# Patient Record
Sex: Female | Born: 1950 | Race: Black or African American | Hispanic: No | Marital: Single | Smoking: Current every day smoker
Health system: Southern US, Community
[De-identification: ages and names within clinical notes are randomized; demographics above are authoritative.]

## PROBLEM LIST (undated history)

## (undated) DIAGNOSIS — I10 Essential (primary) hypertension: Secondary | ICD-10-CM

## (undated) DIAGNOSIS — I499 Cardiac arrhythmia, unspecified: Secondary | ICD-10-CM

## (undated) DIAGNOSIS — E7800 Pure hypercholesterolemia, unspecified: Secondary | ICD-10-CM

## (undated) DIAGNOSIS — J449 Chronic obstructive pulmonary disease, unspecified: Secondary | ICD-10-CM

---

## 2004-07-14 ENCOUNTER — Emergency Department: Payer: Self-pay | Admitting: Emergency Medicine

## 2005-08-15 ENCOUNTER — Emergency Department: Payer: Self-pay | Admitting: Emergency Medicine

## 2006-02-13 IMAGING — CT CT HEAD WITHOUT CONTRAST
1 series · 16 of 29 positions shown, 20 images · non-contrast
Comparison: none

REASON FOR EXAM: headache     rm 14
COMMENTS:

PROCEDURE:     CT  - CT HEAD WITHOUT CONTRAST  - July 15, 2004 [DATE]
RESULT:        No intra-axial or extra-axial pathologic fluid or blood
collections are identified. No mass lesion is noted.  There is no
hydrocephalus.  Initial report was given by [HOSPITAL] at the time of the
study.

[Series 2: without · axial · non-contrast · 0.45mm/px · z∈[-193,-63]mm · 16 of 29 slices shown, 20 images]
[im 2/29  brain]
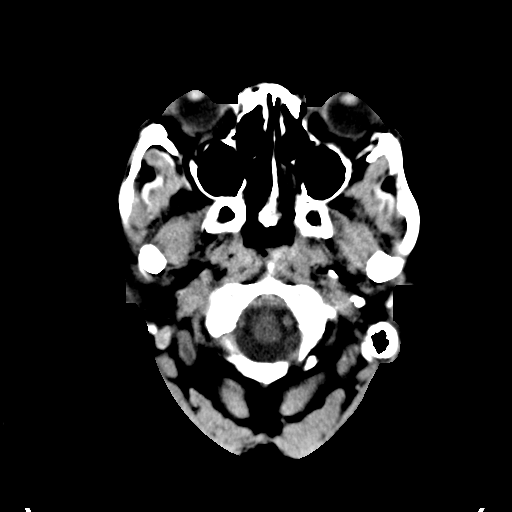
[im 2/29  bone]
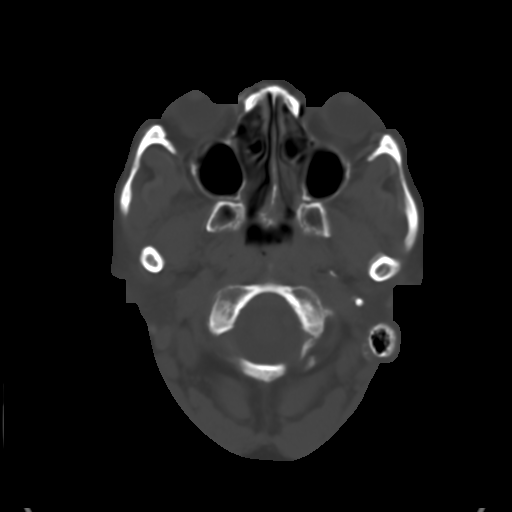
[im 4/29  brain]
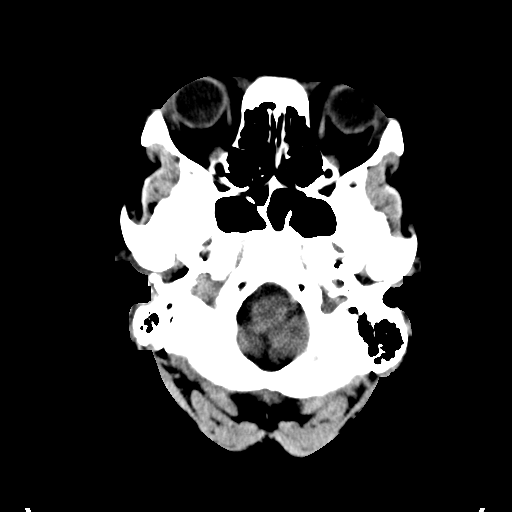
[im 6/29  brain]
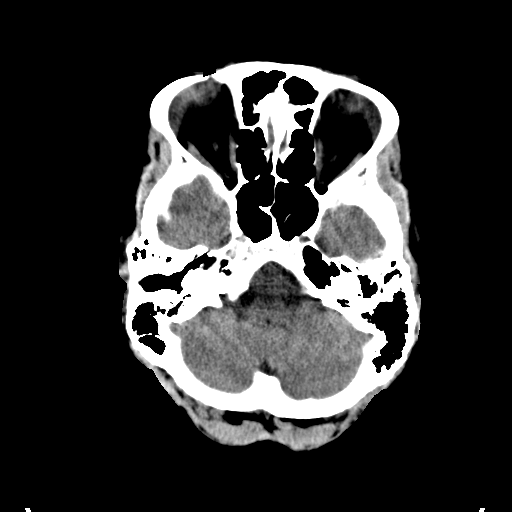
[im 7/29  brain]
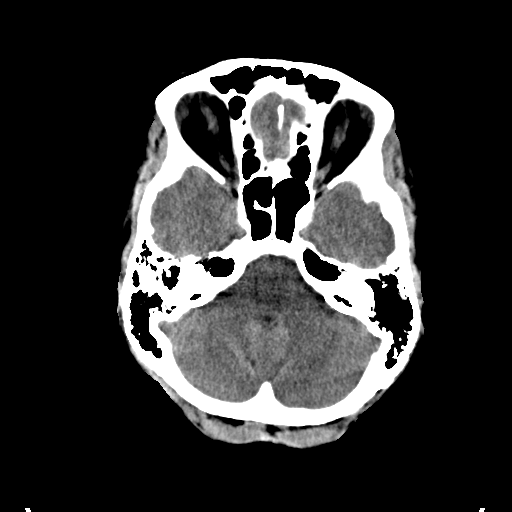
[im 9/29  brain]
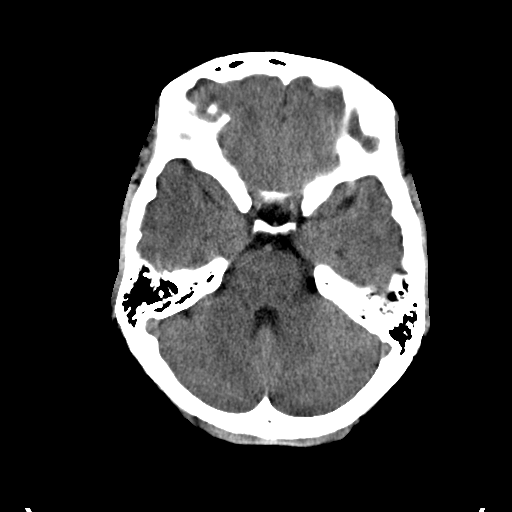
[im 9/29  bone]
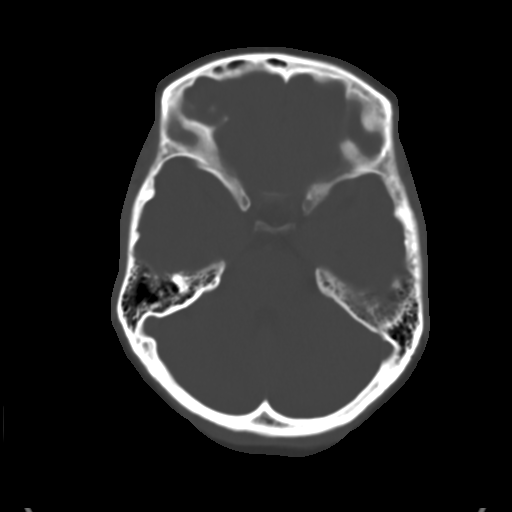
[im 11/29  brain]
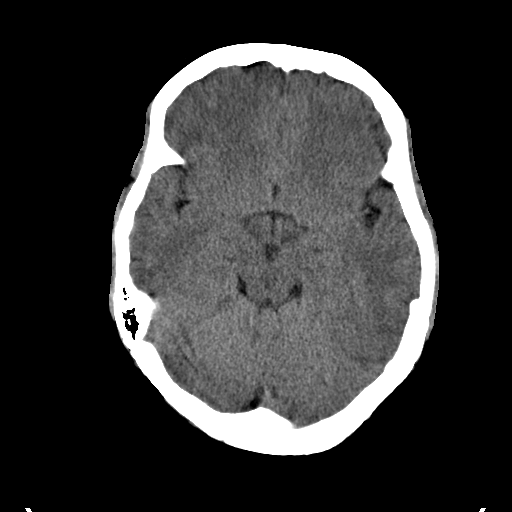
[im 12/29  brain]
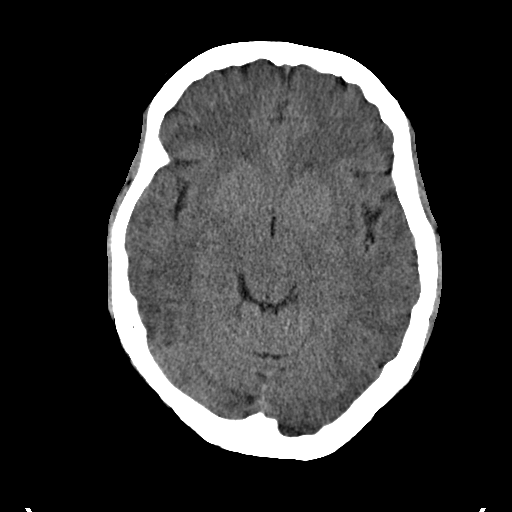
[im 14/29  brain]
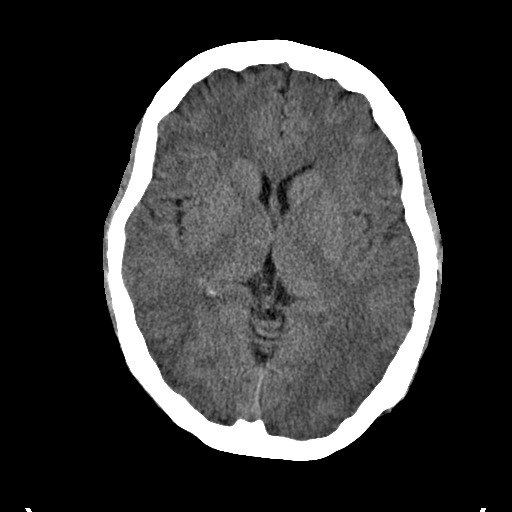
[im 16/29  brain]
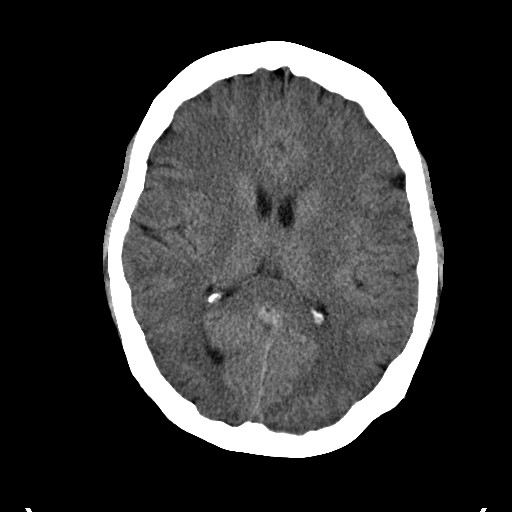
[im 16/29  bone]
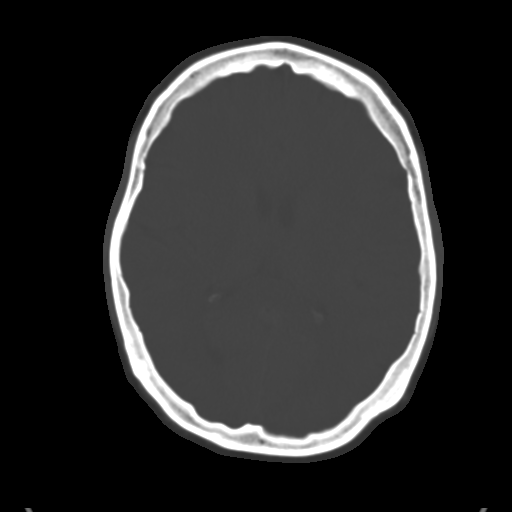
[im 18/29  brain]
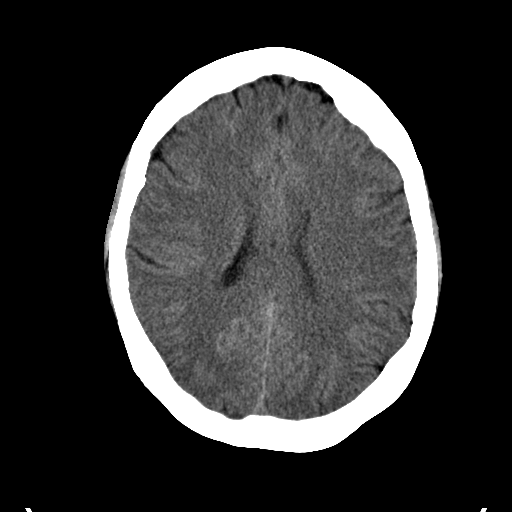
[im 19/29  brain]
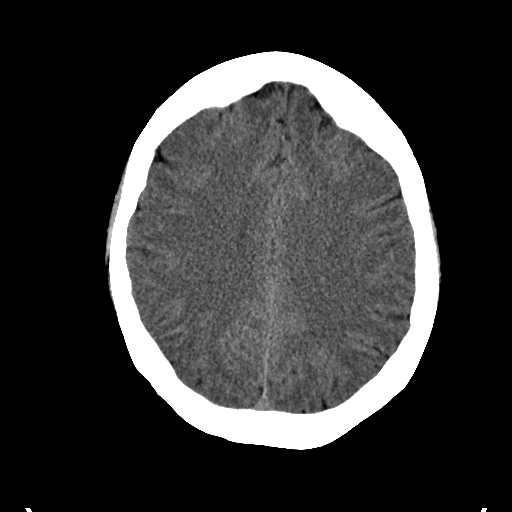
[im 21/29  brain]
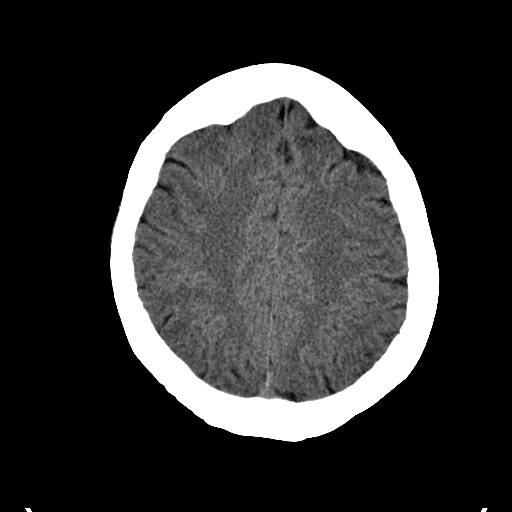
[im 23/29  brain]
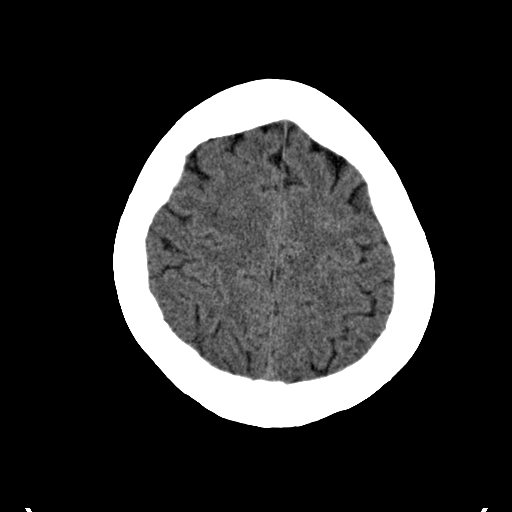
[im 23/29  bone]
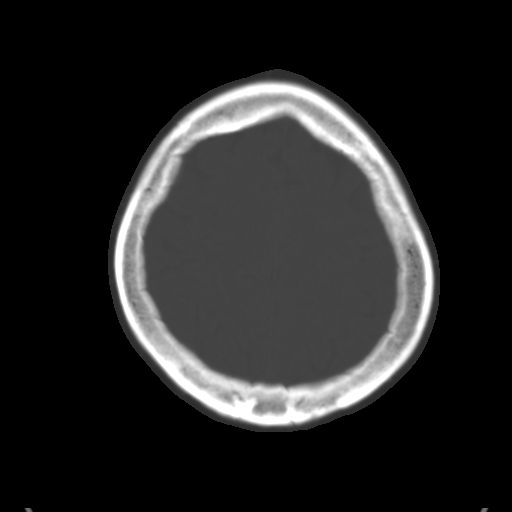
[im 24/29  brain]
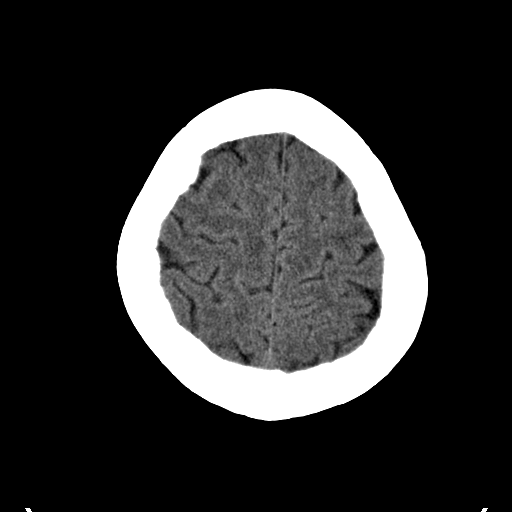
[im 26/29  brain]
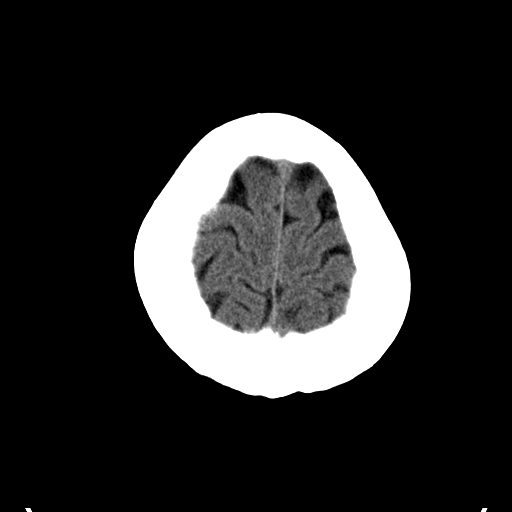
[im 28/29  brain]
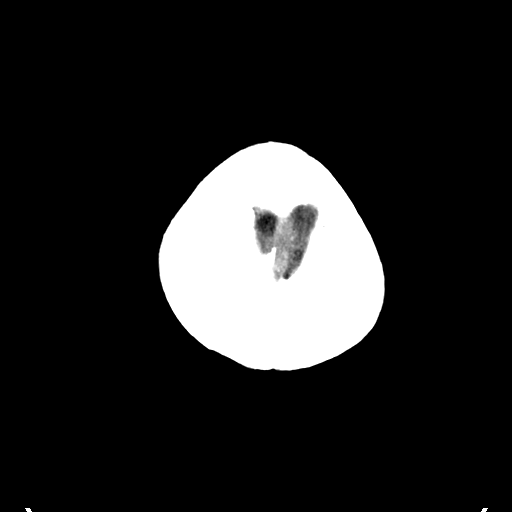

[16 of 29 positions shown; findings below may reference images not displayed]

IMPRESSION: No acute abnormalities are identified.

## 2008-03-01 ENCOUNTER — Other Ambulatory Visit: Payer: Self-pay

## 2008-03-02 ENCOUNTER — Observation Stay: Payer: Self-pay | Admitting: Internal Medicine

## 2009-01-01 ENCOUNTER — Emergency Department: Payer: Self-pay | Admitting: Emergency Medicine

## 2009-10-01 IMAGING — CR DG CHEST 2V
1 series · 2 of 2 positions shown · non-contrast
Comparison: none

REASON FOR EXAM: Chest Pain
COMMENTS:

PROCEDURE:     DXR - DXR CHEST PA (OR AP) AND LATERAL  - March 01, 2008  [DATE]
RESULT:     The lung fields are clear. The heart, mediastinal and osseous
structures show no acute changes. The heart is upper limits to normal in
size or mildly enlarged.

[Series 1: view not recorded · 0.17mm/px · 2 of 2 slices shown]
[im 1/2]
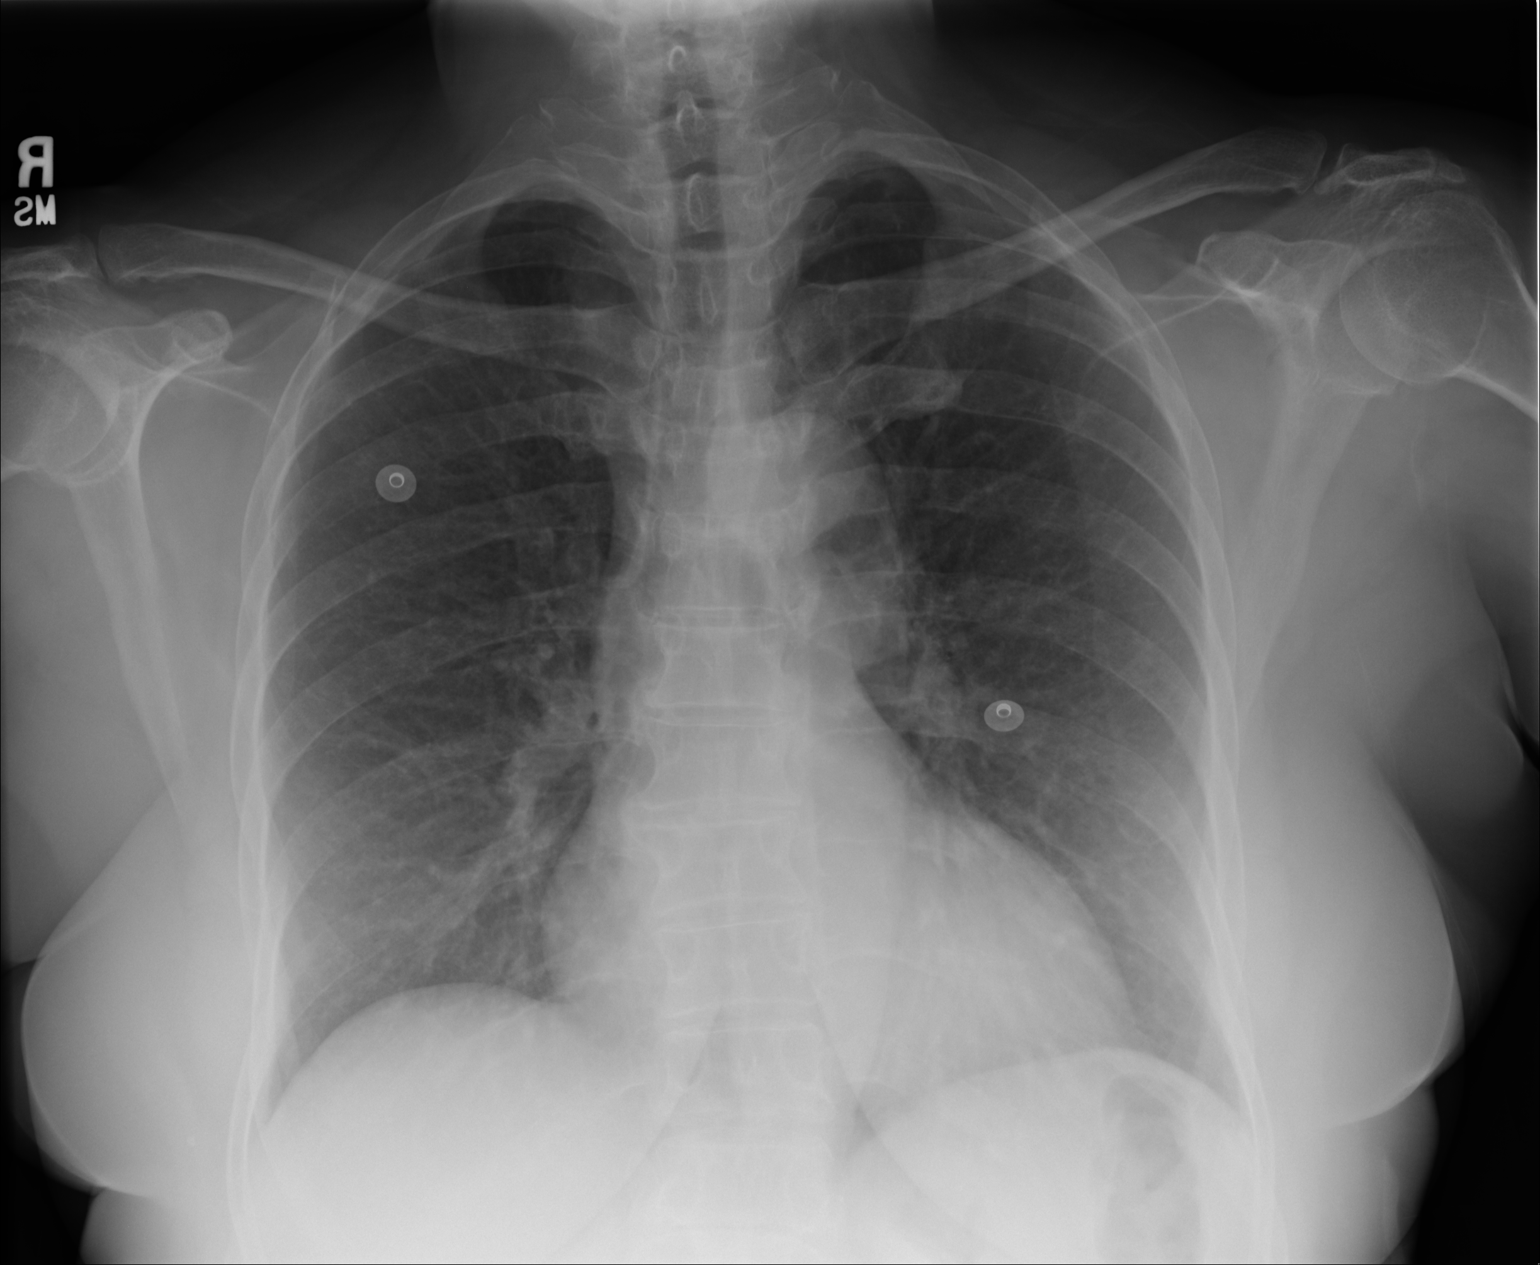
[im 2/2]
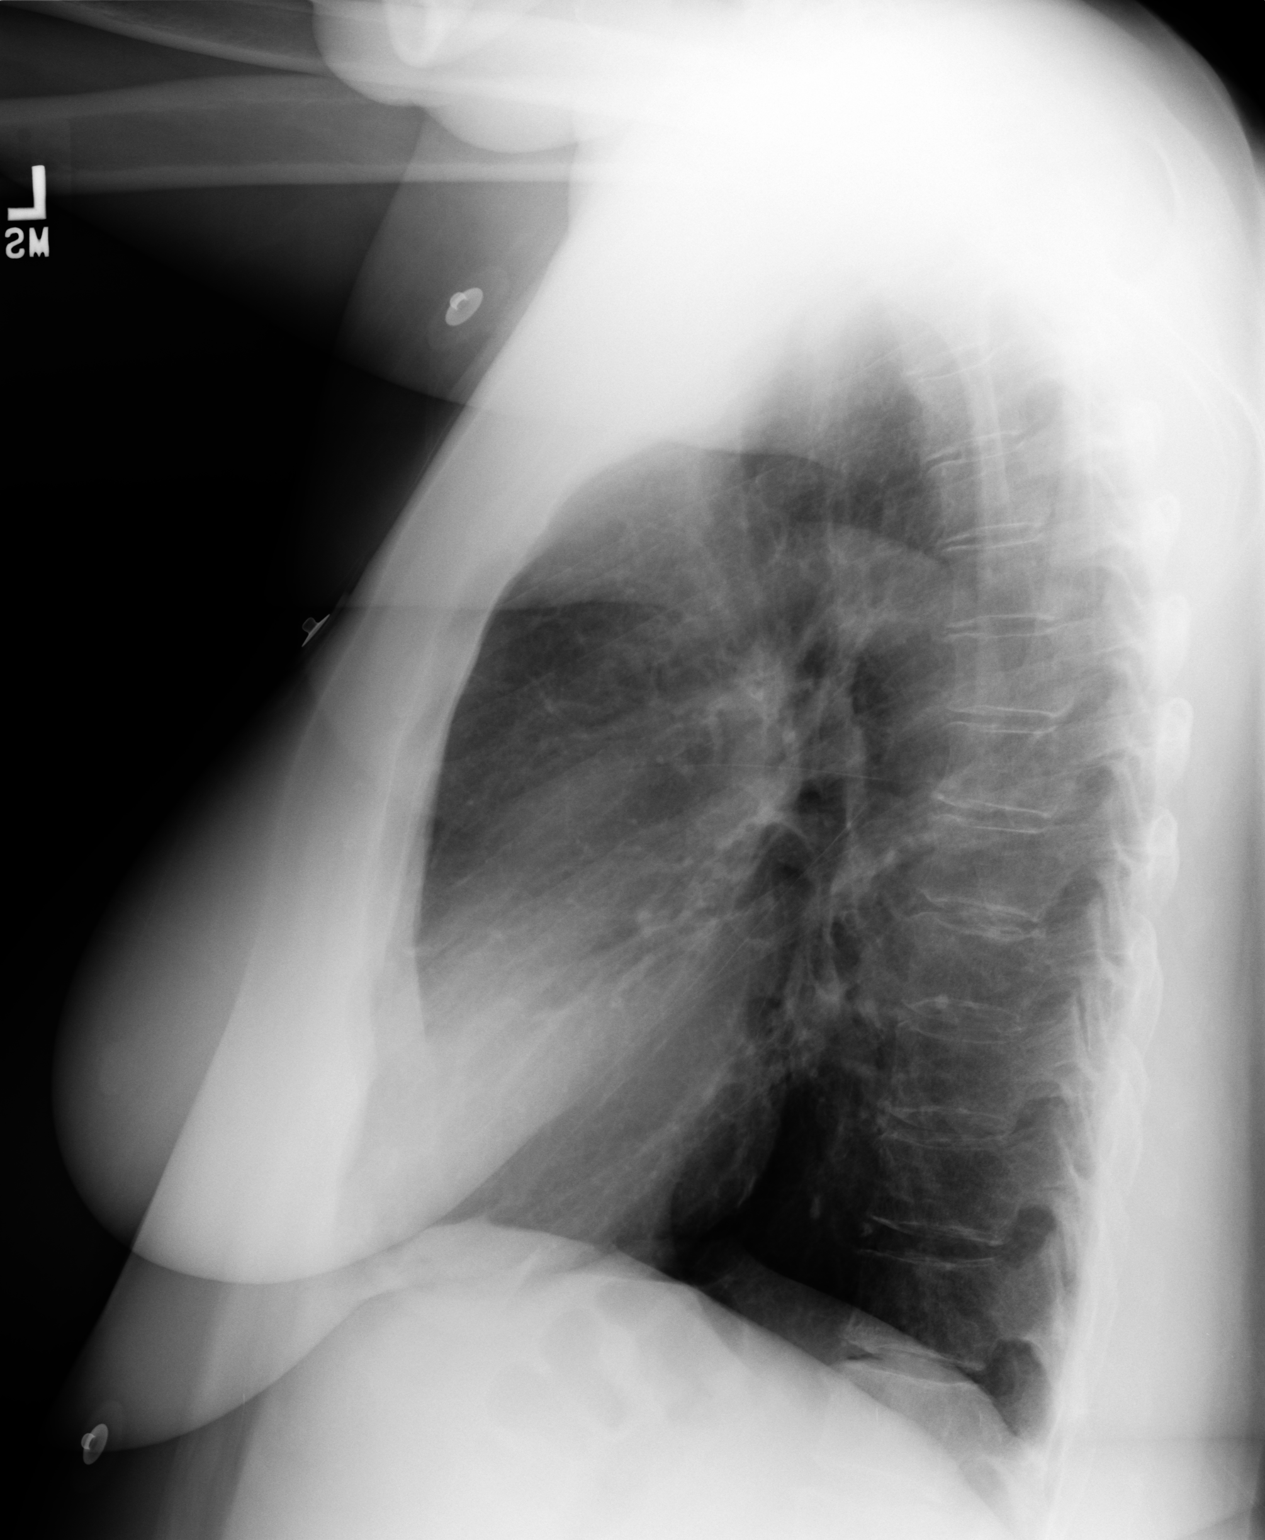

[2 of 2 positions shown; findings below may reference images not displayed]

IMPRESSION: 1.No acute changes are identified.

## 2010-05-21 ENCOUNTER — Ambulatory Visit: Payer: Self-pay | Admitting: Family Medicine

## 2015-07-20 ENCOUNTER — Other Ambulatory Visit: Payer: Self-pay | Admitting: Family Medicine

## 2015-07-20 DIAGNOSIS — Z1231 Encounter for screening mammogram for malignant neoplasm of breast: Secondary | ICD-10-CM

## 2016-01-07 ENCOUNTER — Other Ambulatory Visit: Payer: Self-pay | Admitting: Family Medicine

## 2016-01-07 DIAGNOSIS — Z1239 Encounter for other screening for malignant neoplasm of breast: Secondary | ICD-10-CM

## 2016-01-24 ENCOUNTER — Ambulatory Visit: Payer: Self-pay | Attending: Family Medicine

## 2016-07-27 ENCOUNTER — Inpatient Hospital Stay
Admission: EM | Admit: 2016-07-27 | Discharge: 2016-07-29 | DRG: 287 | Disposition: A | Discharge status: Home / Self Care | Payer: BLUE CROSS/BLUE SHIELD | Attending: Internal Medicine | Admitting: Internal Medicine

## 2016-07-27 ENCOUNTER — Encounter: Payer: Self-pay | Admitting: *Deleted

## 2016-07-27 ENCOUNTER — Emergency Department: Payer: BLUE CROSS/BLUE SHIELD

## 2016-07-27 DIAGNOSIS — R2232 Localized swelling, mass and lump, left upper limb: Secondary | ICD-10-CM | POA: Diagnosis not present

## 2016-07-27 DIAGNOSIS — Z8249 Family history of ischemic heart disease and other diseases of the circulatory system: Secondary | ICD-10-CM | POA: Diagnosis not present

## 2016-07-27 DIAGNOSIS — F329 Major depressive disorder, single episode, unspecified: Secondary | ICD-10-CM | POA: Diagnosis present

## 2016-07-27 DIAGNOSIS — R7303 Prediabetes: Secondary | ICD-10-CM | POA: Diagnosis present

## 2016-07-27 DIAGNOSIS — E876 Hypokalemia: Secondary | ICD-10-CM | POA: Diagnosis present

## 2016-07-27 DIAGNOSIS — E785 Hyperlipidemia, unspecified: Secondary | ICD-10-CM | POA: Diagnosis present

## 2016-07-27 DIAGNOSIS — R509 Fever, unspecified: Secondary | ICD-10-CM | POA: Diagnosis not present

## 2016-07-27 DIAGNOSIS — E669 Obesity, unspecified: Secondary | ICD-10-CM | POA: Diagnosis present

## 2016-07-27 DIAGNOSIS — Z7982 Long term (current) use of aspirin: Secondary | ICD-10-CM | POA: Diagnosis not present

## 2016-07-27 DIAGNOSIS — R739 Hyperglycemia, unspecified: Secondary | ICD-10-CM | POA: Diagnosis present

## 2016-07-27 DIAGNOSIS — Z6832 Body mass index (BMI) 32.0-32.9, adult: Secondary | ICD-10-CM | POA: Diagnosis not present

## 2016-07-27 DIAGNOSIS — R079 Chest pain, unspecified: Secondary | ICD-10-CM

## 2016-07-27 DIAGNOSIS — I10 Essential (primary) hypertension: Secondary | ICD-10-CM | POA: Diagnosis present

## 2016-07-27 DIAGNOSIS — Z880 Allergy status to penicillin: Secondary | ICD-10-CM

## 2016-07-27 DIAGNOSIS — F17200 Nicotine dependence, unspecified, uncomplicated: Secondary | ICD-10-CM | POA: Diagnosis present

## 2016-07-27 DIAGNOSIS — I472 Ventricular tachycardia, unspecified: Secondary | ICD-10-CM

## 2016-07-27 DIAGNOSIS — I4729 Other ventricular tachycardia: Secondary | ICD-10-CM

## 2016-07-27 DIAGNOSIS — Z23 Encounter for immunization: Secondary | ICD-10-CM

## 2016-07-27 DIAGNOSIS — I25110 Atherosclerotic heart disease of native coronary artery with unstable angina pectoris: Secondary | ICD-10-CM | POA: Diagnosis present

## 2016-07-27 DIAGNOSIS — R55 Syncope and collapse: Secondary | ICD-10-CM | POA: Diagnosis present

## 2016-07-27 DIAGNOSIS — M7989 Other specified soft tissue disorders: Secondary | ICD-10-CM

## 2016-07-27 HISTORY — DX: Pure hypercholesterolemia, unspecified: E78.00

## 2016-07-27 HISTORY — DX: Essential (primary) hypertension: I10

## 2016-07-27 HISTORY — DX: Cardiac arrhythmia, unspecified: I49.9

## 2016-07-27 LAB — COMPREHENSIVE METABOLIC PANEL
ALT: 34 U/L (ref 14–54)
ANION GAP: 8 (ref 5–15)
AST: 30 U/L (ref 15–41)
Albumin: 3.8 g/dL (ref 3.5–5.0)
Alkaline Phosphatase: 54 U/L (ref 38–126)
BILIRUBIN TOTAL: 0.2 mg/dL — AB (ref 0.3–1.2)
BUN: 19 mg/dL (ref 6–20)
CO2: 28 mmol/L (ref 22–32)
Calcium: 8.9 mg/dL (ref 8.9–10.3)
Chloride: 106 mmol/L (ref 101–111)
Creatinine, Ser: 0.86 mg/dL (ref 0.44–1.00)
GFR calc Af Amer: >60 mL/min (ref >60)
Glucose, Bld: 153 mg/dL — ABNORMAL HIGH (ref 65–99)
POTASSIUM: 3.1 mmol/L — AB (ref 3.5–5.1)
Sodium: 142 mmol/L (ref 135–145)
TOTAL PROTEIN: 7 g/dL (ref 6.5–8.1)

## 2016-07-27 LAB — BRAIN NATRIURETIC PEPTIDE: B Natriuretic Peptide: 188 pg/mL — ABNORMAL HIGH (ref 0.0–100.0)

## 2016-07-27 LAB — CBC WITH DIFFERENTIAL/PLATELET
Basophils Absolute: 0.1 10*3/uL (ref 0–0.1)
Basophils Relative: 2 %
Eosinophils Absolute: 0.2 10*3/uL (ref 0–0.7)
Eosinophils Relative: 3 %
HEMATOCRIT: 40.5 % (ref 35.0–47.0)
Hemoglobin: 13.5 g/dL (ref 12.0–16.0)
Lymphocytes Relative: 42 %
Lymphs Abs: 2.6 10*3/uL (ref 1.0–3.6)
MCH: 28.2 pg (ref 26.0–34.0)
MCHC: 33.4 g/dL (ref 32.0–36.0)
MCV: 84.3 fL (ref 80.0–100.0)
MONO ABS: 0.4 10*3/uL (ref 0.2–0.9)
MONOS PCT: 7 %
NEUTROS ABS: 3 10*3/uL (ref 1.4–6.5)
Neutrophils Relative %: 46 %
Platelets: 223 10*3/uL (ref 150–440)
RBC: 4.8 MIL/uL (ref 3.80–5.20)
RDW: 13.8 % (ref 11.5–14.5)
WBC: 6.3 10*3/uL (ref 3.6–11.0)

## 2016-07-27 LAB — TROPONIN I
Troponin I: <0.03 ng/mL (ref <0.03)
Troponin I: <0.03 ng/mL (ref <0.03)

## 2016-07-27 LAB — PROTIME-INR
INR: 0.92
Prothrombin Time: 12.4 seconds (ref 11.4–15.2)

## 2016-07-27 LAB — MRSA PCR SCREENING: MRSA BY PCR: NEGATIVE

## 2016-07-27 LAB — TSH: TSH: 0.653 u[IU]/mL (ref 0.350–4.500)

## 2016-07-27 LAB — GLUCOSE, CAPILLARY: Glucose-Capillary: 127 mg/dL — ABNORMAL HIGH (ref 65–99)

## 2016-07-27 LAB — MAGNESIUM: MAGNESIUM: 2.4 mg/dL (ref 1.7–2.4)

## 2016-07-27 MED ORDER — ACETAMINOPHEN 650 MG RE SUPP: 650.0000 mg | Freq: Four times a day (QID) | RECTAL | Status: DC | PRN

## 2016-07-27 MED ORDER — ASPIRIN 81 MG PO CHEW
324.0000 mg | CHEWABLE_TABLET | Freq: Once | ORAL | Status: AC
  Administered 2016-07-27: 324 mg via ORAL

## 2016-07-27 MED ORDER — ENOXAPARIN SODIUM 40 MG/0.4ML ~~LOC~~ SOLN
40.0000 mg | SUBCUTANEOUS | Status: DC
  Administered 2016-07-27 – 2016-07-28 (×2): 40 mg via SUBCUTANEOUS
  Filled 2016-07-27 (×2): qty 0.4

## 2016-07-27 MED ORDER — ONDANSETRON HCL 4 MG PO TABS: 4.0000 mg | ORAL_TABLET | Freq: Four times a day (QID) | ORAL | Status: DC | PRN

## 2016-07-27 MED ORDER — METOPROLOL TARTRATE 5 MG/5ML IV SOLN
2.5000 mg | Freq: Once | INTRAVENOUS | Status: AC
  Administered 2016-07-27: 2.5 mg via INTRAVENOUS
  Filled 2016-07-27: qty 5

## 2016-07-27 MED ORDER — SODIUM CHLORIDE 0.9 % IV SOLN
INTRAVENOUS | Status: DC
  Administered 2016-07-27 (×2): via INTRAVENOUS
  Filled 2016-07-27 (×6): qty 200

## 2016-07-27 MED ORDER — AMIODARONE HCL IN DEXTROSE 360-4.14 MG/200ML-% IV SOLN: 30.0000 mg/h | INTRAVENOUS | Status: DC

## 2016-07-27 MED ORDER — SODIUM CHLORIDE 0.9 % IV SOLN: 250.0000 mL | INTRAVENOUS | Status: DC | PRN

## 2016-07-27 MED ORDER — AMIODARONE LOAD VIA INFUSION
150.0000 mg | Freq: Once | INTRAVENOUS | Status: DC
  Filled 2016-07-27 (×2): qty 83.34

## 2016-07-27 MED ORDER — SODIUM CHLORIDE 0.9% FLUSH
3.0000 mL | Freq: Two times a day (BID) | INTRAVENOUS | Status: DC
  Administered 2016-07-27 – 2016-07-29 (×4): 3 mL via INTRAVENOUS

## 2016-07-27 MED ORDER — PNEUMOCOCCAL VAC POLYVALENT 25 MCG/0.5ML IJ INJ
0.5000 mL | INJECTION | INTRAMUSCULAR | Status: AC
  Administered 2016-07-28: 0.5 mL via INTRAMUSCULAR
  Filled 2016-07-27: qty 0.5

## 2016-07-27 MED ORDER — SODIUM CHLORIDE 0.9 % WEIGHT BASED INFUSION
3.0000 mL/kg/h | INTRAVENOUS | Status: AC
  Administered 2016-07-28: 3 mL/kg/h via INTRAVENOUS

## 2016-07-27 MED ORDER — AMIODARONE IV BOLUS ONLY 150 MG/100ML
INTRAVENOUS | Status: AC
  Administered 2016-07-27: 150 mg
  Filled 2016-07-27: qty 100

## 2016-07-27 MED ORDER — MORPHINE SULFATE (PF) 4 MG/ML IV SOLN
2.0000 mg | INTRAVENOUS | Status: DC | PRN
  Administered 2016-07-28: 2 mg via INTRAVENOUS
  Filled 2016-07-27: qty 1

## 2016-07-27 MED ORDER — ONDANSETRON HCL 4 MG/2ML IJ SOLN: 4.0000 mg | Freq: Four times a day (QID) | INTRAMUSCULAR | Status: DC | PRN

## 2016-07-27 MED ORDER — ASPIRIN 81 MG PO CHEW
CHEWABLE_TABLET | ORAL | Status: AC
  Administered 2016-07-27: 324 mg via ORAL
  Filled 2016-07-27: qty 4

## 2016-07-27 MED ORDER — METOPROLOL TARTRATE 25 MG PO TABS
25.0000 mg | ORAL_TABLET | Freq: Two times a day (BID) | ORAL | Status: DC
  Administered 2016-07-28 – 2016-07-29 (×3): 25 mg via ORAL
  Filled 2016-07-27 (×3): qty 1

## 2016-07-27 MED ORDER — SODIUM CHLORIDE 0.9 % IV SOLN
Freq: Once | INTRAVENOUS | Status: AC
  Administered 2016-07-27: 11:00:00 via INTRAVENOUS
  Filled 2016-07-27: qty 500

## 2016-07-27 MED ORDER — ASPIRIN 81 MG PO CHEW: 81.0000 mg | CHEWABLE_TABLET | ORAL | Status: DC

## 2016-07-27 MED ORDER — NITROGLYCERIN 2 % TD OINT
0.5000 [in_us] | TOPICAL_OINTMENT | Freq: Four times a day (QID) | TRANSDERMAL | Status: DC
  Administered 2016-07-27 – 2016-07-28 (×4): 0.5 [in_us] via TOPICAL
  Filled 2016-07-27 (×4): qty 1

## 2016-07-27 MED ORDER — BISACODYL 10 MG RE SUPP: 10.0000 mg | Freq: Every day | RECTAL | Status: DC | PRN

## 2016-07-27 MED ORDER — SODIUM CHLORIDE 0.9% FLUSH
3.0000 mL | Freq: Two times a day (BID) | INTRAVENOUS | Status: DC
  Administered 2016-07-27 – 2016-07-28 (×2): 3 mL via INTRAVENOUS

## 2016-07-27 MED ORDER — MAGNESIUM SULFATE 2 GM/50ML IV SOLN
INTRAVENOUS | Status: AC
  Administered 2016-07-27: 2 g via INTRAVENOUS
  Filled 2016-07-27: qty 50

## 2016-07-27 MED ORDER — SODIUM CHLORIDE 0.9 % IV SOLN
INTRAVENOUS | Status: AC
  Filled 2016-07-27: qty 200

## 2016-07-27 MED ORDER — FAMOTIDINE IN NACL 20-0.9 MG/50ML-% IV SOLN: 20.0000 mg | Freq: Two times a day (BID) | INTRAVENOUS | Status: DC

## 2016-07-27 MED ORDER — INFLUENZA VAC SPLIT QUAD 0.5 ML IM SUSY
0.5000 mL | PREFILLED_SYRINGE | INTRAMUSCULAR | Status: AC
  Administered 2016-07-28: 0.5 mL via INTRAMUSCULAR
  Filled 2016-07-27: qty 0.5

## 2016-07-27 MED ORDER — PRAVASTATIN SODIUM 40 MG PO TABS
40.0000 mg | ORAL_TABLET | Freq: Every day | ORAL | Status: DC
  Administered 2016-07-27 – 2016-07-29 (×2): 40 mg via ORAL
  Filled 2016-07-27 (×2): qty 1

## 2016-07-27 MED ORDER — AMIODARONE HCL IN DEXTROSE 360-4.14 MG/200ML-% IV SOLN: 60.0000 mg/h | INTRAVENOUS | Status: DC

## 2016-07-27 MED ORDER — ASPIRIN EC 81 MG PO TBEC
81.0000 mg | DELAYED_RELEASE_TABLET | Freq: Every day | ORAL | Status: DC
  Administered 2016-07-27 – 2016-07-29 (×3): 81 mg via ORAL
  Filled 2016-07-27 (×3): qty 1

## 2016-07-27 MED ORDER — ACETAMINOPHEN 325 MG PO TABS
650.0000 mg | ORAL_TABLET | Freq: Four times a day (QID) | ORAL | Status: DC | PRN
  Administered 2016-07-27 – 2016-07-29 (×5): 650 mg via ORAL
  Filled 2016-07-27 (×5): qty 2

## 2016-07-27 MED ORDER — SODIUM CHLORIDE 0.9% FLUSH
3.0000 mL | INTRAVENOUS | Status: DC | PRN
Start: 2016-07-27 — End: 2016-07-28

## 2016-07-27 MED ORDER — FAMOTIDINE 20 MG PO TABS
20.0000 mg | ORAL_TABLET | Freq: Two times a day (BID) | ORAL | Status: DC
  Administered 2016-07-27 – 2016-07-29 (×3): 20 mg via ORAL
  Filled 2016-07-27 (×3): qty 1

## 2016-07-27 MED ORDER — NITROGLYCERIN 0.4 MG SL SUBL: 0.4000 mg | SUBLINGUAL_TABLET | SUBLINGUAL | Status: DC | PRN

## 2016-07-27 MED ORDER — POTASSIUM CHLORIDE IN NACL 20-0.9 MEQ/L-% IV SOLN
INTRAVENOUS | Status: DC
  Administered 2016-07-27 – 2016-07-28 (×2): via INTRAVENOUS
  Filled 2016-07-27 (×6): qty 1000

## 2016-07-27 MED ORDER — DOCUSATE SODIUM 100 MG PO CAPS
100.0000 mg | ORAL_CAPSULE | Freq: Two times a day (BID) | ORAL | Status: DC
  Administered 2016-07-27 – 2016-07-29 (×3): 100 mg via ORAL
  Filled 2016-07-27 (×3): qty 1

## 2016-07-27 MED ORDER — FLUOXETINE HCL 20 MG PO CAPS
20.0000 mg | ORAL_CAPSULE | Freq: Every day | ORAL | Status: DC
  Administered 2016-07-27 – 2016-07-29 (×2): 20 mg via ORAL
  Filled 2016-07-27 (×2): qty 1

## 2016-07-27 MED ORDER — METOPROLOL TARTRATE 25 MG PO TABS: 25.0000 mg | ORAL_TABLET | Freq: Two times a day (BID) | ORAL | Status: DC

## 2016-07-27 MED ORDER — MAGNESIUM SULFATE 2 GM/50ML IV SOLN
2.0000 g | Freq: Once | INTRAVENOUS | Status: AC
  Administered 2016-07-27: 2 g via INTRAVENOUS

## 2016-07-27 MED ORDER — SODIUM CHLORIDE 0.9 % WEIGHT BASED INFUSION: 1.0000 mL/kg/h | INTRAVENOUS | Status: DC

## 2016-07-27 MED ORDER — POTASSIUM CHLORIDE CRYS ER 20 MEQ PO TBCR
40.0000 meq | EXTENDED_RELEASE_TABLET | Freq: Once | ORAL | Status: AC
  Administered 2016-07-27: 40 meq via ORAL
  Filled 2016-07-27: qty 2

## 2016-07-27 NOTE — Progress Notes (Signed)
Chaplain received an order to visit with pt in room Ic3. Provided information for an Advanced Directive.    07/27/16 1221  Clinical Encounter Type  Visited With Patient;Patient and family together  Visit Type Initial  Referral From Nurse  Consult/Referral To Chaplain  Spiritual Encounters  Spiritual Needs Other (Comment)

## 2016-07-27 NOTE — Consult Note (Signed)
Reason for Consult: Unstable angina nonsustained ventricular tachycardia Referring Physician: Dr. Georgie Chard hospitalist, [facility]  Beth Odonnell is an 66 y.o. female.  HPI: Patient is a female history of multiple medical problems smoking borderline diabetes hypertension hyperlipidemia presented with anginal symptoms midsternal chest tightness over the last few weeks with radiation to the left jaw and neck. Patient also complains of dyspnea shortness of breath at rest patient also had palpitations and tachycardia. Patient complains of near syncope no nausea vomiting or diarrhea she's been compliant with her medications except she ran out of her clonidine which made her blood pressure higher than normal. Patient was seen in emergency room found to have nonsustained ventricular tachycardia place and amiodarone which improved her symptoms but she still complains of intermittent chest discomfort or dyspnea no previous myocardial infarction of recent functional study.  Past Medical History:  Diagnosis Date  . Hypercholesteremia   . Hypertension   . Irregular heart beat     History reviewed. No pertinent surgical history.  History reviewed. No pertinent family history.  Social History:  reports that she has been smoking.  She has never used smokeless tobacco. She reports that she does not drink alcohol. Her drug history is not on file.  Allergies:  Allergies  Allergen Reactions  . Penicillins Other (See Comments)    Patient passed out.  Has patient had a PCN reaction causing immediate rash, facial/tongue/throat swelling, SOB or lightheadedness with hypotension: No Has patient had a PCN reaction causing severe rash involving mucus membranes or skin necrosis: No Has patient had a PCN reaction that required hospitalization No Has patient had a PCN reaction occurring within the last 10 years: No If all of the above answers are "NO", then may proceed with  Cephalosporin use.    Medications: I have reviewed the patient's current medications.  Results for orders placed or performed during the hospital encounter of 07/27/16 (from the past 48 hour(s))  Brain natriuretic peptide     Status: Abnormal   Collection Time: 07/27/16  8:41 AM  Result Value Ref Range   B Natriuretic Peptide 188.0 (H) 0.0 - 100.0 pg/mL  CBC with Differential/Platelet     Status: None   Collection Time: 07/27/16  8:41 AM  Result Value Ref Range   WBC 6.3 3.6 - 11.0 K/uL   RBC 4.80 3.80 - 5.20 MIL/uL   Hemoglobin 13.5 12.0 - 16.0 g/dL   HCT 40.5 35.0 - 47.0 %   MCV 84.3 80.0 - 100.0 fL   MCH 28.2 26.0 - 34.0 pg   MCHC 33.4 32.0 - 36.0 g/dL   RDW 13.8 11.5 - 14.5 %   Platelets 223 150 - 440 K/uL   Neutrophils Relative % 46 %   Neutro Abs 3.0 1.4 - 6.5 K/uL   Lymphocytes Relative 42 %   Lymphs Abs 2.6 1.0 - 3.6 K/uL   Monocytes Relative 7 %   Monocytes Absolute 0.4 0.2 - 0.9 K/uL   Eosinophils Relative 3 %   Eosinophils Absolute 0.2 0 - 0.7 K/uL   Basophils Relative 2 %   Basophils Absolute 0.1 0 - 0.1 K/uL  Comprehensive metabolic panel     Status: Abnormal   Collection Time: 07/27/16  8:41 AM  Result Value Ref Range   Sodium 142 135 - 145 mmol/L   Potassium 3.1 (L) 3.5 - 5.1 mmol/L   Chloride 106 101 - 111 mmol/L   CO2 28 22 - 32 mmol/L   Glucose, Bld  153 (H) 65 - 99 mg/dL   BUN 19 6 - 20 mg/dL   Creatinine, Ser 0.86 0.44 - 1.00 mg/dL   Calcium 8.9 8.9 - 10.3 mg/dL   Total Protein 7.0 6.5 - 8.1 g/dL   Albumin 3.8 3.5 - 5.0 g/dL   AST 30 15 - 41 U/L   ALT 34 14 - 54 U/L   Alkaline Phosphatase 54 38 - 126 U/L   Total Bilirubin 0.2 (L) 0.3 - 1.2 mg/dL   GFR calc non Af Amer >60 >60 mL/min   GFR calc Af Amer >60 >60 mL/min    Comment: (NOTE) The eGFR has been calculated using the CKD EPI equation. This calculation has not been validated in all clinical situations. eGFR's persistently <60 mL/min signify possible Chronic Kidney Disease.    Anion gap  8 5 - 15  Magnesium     Status: None   Collection Time: 07/27/16  8:41 AM  Result Value Ref Range   Magnesium 2.4 1.7 - 2.4 mg/dL  Protime-INR     Status: None   Collection Time: 07/27/16  8:41 AM  Result Value Ref Range   Prothrombin Time 12.4 11.4 - 15.2 seconds   INR 0.92   Troponin I     Status: None   Collection Time: 07/27/16  8:41 AM  Result Value Ref Range   Troponin I <0.03 <0.03 ng/mL  TSH     Status: None   Collection Time: 07/27/16  8:41 AM  Result Value Ref Range   TSH 0.653 0.350 - 4.500 uIU/mL    Comment: Performed by a 3rd Generation assay with a functional sensitivity of <=0.01 uIU/mL.  Glucose, capillary     Status: Abnormal   Collection Time: 07/27/16 12:04 PM  Result Value Ref Range   Glucose-Capillary 127 (H) 65 - 99 mg/dL  MRSA PCR Screening     Status: None   Collection Time: 07/27/16 12:13 PM  Result Value Ref Range   MRSA by PCR NEGATIVE NEGATIVE    Comment:        The GeneXpert MRSA Assay (FDA approved for NASAL specimens only), is one component of a comprehensive MRSA colonization surveillance program. It is not intended to diagnose MRSA infection nor to guide or monitor treatment for MRSA infections.     Dg Chest Port 1 View  Result Date: 07/27/2016 CLINICAL DATA:  Worsening chest pain. Palpitations. Dizziness and shortness of breath. EXAM: PORTABLE CHEST 1 VIEW COMPARISON:  03/01/2008 FINDINGS: Pacemaker/ defibrillator is overlie the chest. Heart size is normal. There is aortic atherosclerosis. Lungs are clear. The vascularity is normal. No effusions. IMPRESSION: No active disease.  Overlying devices.  Aortic atherosclerosis. Electronically Signed   By: Nelson Chimes M.D.   On: 07/27/2016 07:51    Review of Systems  Constitutional: Positive for diaphoresis and malaise/fatigue.  HENT: Positive for congestion.   Eyes: Negative.   Respiratory: Positive for shortness of breath.   Cardiovascular: Positive for chest pain, palpitations, orthopnea  and PND.  Gastrointestinal: Negative.   Genitourinary: Negative.   Musculoskeletal: Negative.   Skin: Negative.   Neurological: Positive for dizziness and weakness.  Endo/Heme/Allergies: Negative.   Psychiatric/Behavioral: Positive for depression.   Blood pressure 118/75, pulse (!) 54, temperature 97.6 F (36.4 C), resp. rate 16, height _0  (1.803 m), weight 104.3 kg (230 lb), SpO2 93 %. Physical Exam  Nursing note and vitals reviewed. Constitutional: She is oriented to person, place, and time. She appears well-developed and well-nourished.  HENT:  Head: Normocephalic and atraumatic.  Eyes: Conjunctivae and EOM are normal. Pupils are equal, round, and reactive to light.  Neck: Normal range of motion. Neck supple.  Cardiovascular: Normal rate and regular rhythm.  Exam reveals gallop.   Murmur heard. Respiratory: Effort normal and breath sounds normal.  GI: Soft. Bowel sounds are normal.  Musculoskeletal: Normal range of motion.  Neurological: She is alert and oriented to person, place, and time. She has normal reflexes.  Skin: Skin is warm and dry.  Psychiatric: She has a normal mood and affect.    Assessment/Plan: Nonsustained ventricular tachycardia Unstable angina Hypertension Hypokalemia Near-syncope Chest pain Depression Hyperlipidemia Smoking Borderline diabetes Mild obesity Shortness of breath with dyspnea Palpitations tachycardia . 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

## 2016-07-27 NOTE — ED Provider Notes (Signed)
[facility] Emergency Department Provider Note  ____________________________________________  Time seen: Approximately 7:17 AM  I have reviewed the triage vital signs and the nursing notes.   HISTORY  Chief Complaint Chest Pain; Shortness of Breath; and Dizziness   HPI Beth Odonnell is a 66 y.o. female with a history of hypertension and hyperlipidemia who presents for evaluation of palpitations and dizziness. Patient reports that she ran out of her clonidine the beginning of the week for the last 3-4 days she has had multiple episodes a day of palpitations, dizziness, and mild chest tightness. She also has had some shortness of breath. She reports that her chest tightness centrally, radiating to her neck, jaw, and back. Patient has family history of ischemic heart disease and reports that her mother passed away from a heart attack in her 73s or 20s. Patient is a smoker.Last echocardiogram in [id number] showing diastolic dysfunction. Hasn't had a stress test in over 10 years. Patient currently endorses that her chest pain is moderate. These episodes have been happening every 5 minutes.  Past Medical History:  Diagnosis Date  . Hypercholesteremia   . Hypertension   . Irregular heart beat     Patient Active Problem List   Diagnosis Date Noted  . Nonsustained ventricular tachycardia ([place]) 07/27/2016  . Chest pain 07/27/2016  . Hypokalemia 07/27/2016  . Near syncope 07/27/2016  . NSVT (nonsustained ventricular tachycardia) ([place]) 07/27/2016    History reviewed. No pertinent surgical history.  Prior to Admission medications   Medication Sig Start Date End Date Taking? Authorizing Provider  amLODipine (NORVASC) 10 MG tablet Take 10 mg by mouth daily. 05/06/16  Yes Historical Provider, MD  aspirin EC 81 MG tablet Take 81 mg by mouth daily.   Yes Historical Provider, MD  atenolol (TENORMIN) 50 MG tablet Take 50 mg by mouth daily.   Yes Historical Provider, MD    cetirizine (ZYRTEC) 10 MG tablet Take 10 mg by mouth daily.   Yes Historical Provider, MD  cloNIDine (CATAPRES) 0.3 MG tablet Take 0.3 mg by mouth daily. 07/25/16  Yes Historical Provider, MD  FLUoxetine (PROZAC) 20 MG capsule Take 20 mg by mouth daily.   Yes Historical Provider, MD  lisinopril-hydrochlorothiazide (PRINZIDE,ZESTORETIC) 20-12.5 MG tablet Take 2 tablets by mouth daily.   Yes Historical Provider, MD  pravastatin (PRAVACHOL) 40 MG tablet Take 40 mg by mouth daily.   Yes Historical Provider, MD    Allergies Penicillins  History reviewed. No pertinent family history.  Social History Social History  Substance Use Topics  . Smoking status: Current Every Day Smoker  . Smokeless tobacco: Never Used  . Alcohol use No    Review of Systems  Constitutional: Negative for fever. + Lightheadedness Eyes: Negative for visual changes. ENT: Negative for sore throat. Neck: No neck pain  Cardiovascular: + chest pain. Respiratory: + shortness of breath. Gastrointestinal: Negative for abdominal pain, vomiting or diarrhea. Genitourinary: Negative for dysuria. Musculoskeletal: Negative for back pain. Skin: Negative for rash. Neurological: Negative for headaches, weakness or numbness. Psych: No SI or HI  ____________________________________________   PHYSICAL EXAM:  VITAL SIGNS: ED Triage Vitals  Enc Vitals Group     BP 07/27/16 0648 118/72     Pulse Rate 07/27/16 0648 (!) 57     Resp 07/27/16 0700 (!) 23     Temp 07/27/16 0648 97.6 F (36.4 C)     Temp Source 07/27/16 0648 Oral     SpO2 07/27/16 0648 100 %  Weight 07/27/16 0650 230 lb (104.3 kg)     Height 07/27/16 0650 5\' 11"  (1.803 m)     Head Circumference --      Peak Flow --      Pain Score 07/27/16 0651 0     Pain Loc --      Pain Edu? --      Excl. in [place]? --     Constitutional: Alert and oriented. Well appearing and in no apparent distress. HEENT:      Head: Normocephalic and atraumatic.         Eyes:  Conjunctivae are normal. Sclera is non-icteric. EOMI. PERRL      Mouth/Throat: Mucous membranes are moist.       Neck: Supple with no signs of meningismus. Cardiovascular: Regular rate and rhythm. No murmurs, gallops, or rubs. 2+ symmetrical distal pulses are present in all extremities. No JVD. Respiratory: Normal respiratory effort. Lungs are clear to auscultation bilaterally. No wheezes, crackles, or rhonchi.  Gastrointestinal: Soft, non tender, and non distended with positive bowel sounds. No rebound or guarding. Genitourinary: No CVA tenderness. Musculoskeletal: Nontender with normal range of motion in all extremities. No edema, cyanosis, or erythema of extremities. Neurologic: Normal speech and language. Face is symmetric. Moving all extremities. No gross focal neurologic deficits are appreciated. Skin: Skin is warm, dry and intact. No rash noted. Psychiatric: Mood and affect are normal. Speech and behavior are normal.  ____________________________________________   LABS (all labs ordered are listed, but only abnormal results are displayed)  Labs Reviewed  BRAIN NATRIURETIC PEPTIDE - Abnormal; Notable for the following:       Result Value   B Natriuretic Peptide 188.0 (*)    All other components within normal limits  COMPREHENSIVE METABOLIC PANEL - Abnormal; Notable for the following:    Potassium 3.1 (*)    Glucose, Bld 153 (*)    Total Bilirubin 0.2 (*)    All other components within normal limits  GLUCOSE, CAPILLARY - Abnormal; Notable for the following:    Glucose-Capillary 127 (*)    All other components within normal limits  MRSA PCR SCREENING  CBC WITH DIFFERENTIAL/PLATELET  MAGNESIUM  PROTIME-INR  TROPONIN I  TSH  CBC WITH DIFFERENTIAL/PLATELET  TROPONIN I  TROPONIN I   ____________________________________________  EKG  ED ECG REPORT I, Rudene Re, the attending physician, personally viewed and interpreted this ECG.   Normal sinus rhythm with  frequent runs of V. tach up to 4 beats at a time, normal intervals, normal axis, no ST elevations or depressions.  ____________________________________________  RADIOLOGY  CXR: Negative  ____________________________________________   PROCEDURES  Procedure(s) performed: None Procedures Critical Care performed: yes  CRITICAL CARE Performed by: Rudene Re  ?  Total critical care time: 40 min  Critical care time was exclusive of separately billable procedures and treating other patients.  Critical care was necessary to treat or prevent imminent or life-threatening deterioration.  Critical care was time spent personally by me on the following activities: development of treatment plan with patient and/or surrogate as well as nursing, discussions with consultants, evaluation of patient's response to treatment, examination of patient, obtaining history from patient or surrogate, ordering and performing treatments and interventions, ordering and review of laboratory studies, ordering and review of radiographic studies, pulse oximetry and re-evaluation of patient's condition.  ____________________________________________   INITIAL IMPRESSION / ASSESSMENT AND PLAN / ED COURSE  66 y.o. female with a history of hypertension and hyperlipidemia who presents for evaluation of palpitations and  dizziness found to be having frequent 4 beat run of V. tach. Patient with good mental status and normal blood pressure. She was given a bolus of amiodarone with improvement and reduce frequency of V. tach beats. She was also given to grams of magnesium. She'll be placed on amiodarone drip. She was given full dose of aspirin in case this is ischemic. Labs were sent including electrolytes, troponin, BNP.  ED COURSE: Electrolytes were repleted. Patient remained with runs of V. tach on amiodarone drip with normal mental status and blood pressure. Discussed with Dr. Jerrye Beavers who recommended IV metoprolol  which was given. Patient was then admitted to the hospitalist service for further management.     Pertinent labs & imaging results that were available during my care of the patient were reviewed by me and considered in my medical decision making (see chart for details).    ____________________________________________   FINAL CLINICAL IMPRESSION(S) / ED DIAGNOSES  Final diagnoses:  Ventricular tachycardia ([place])      NEW MEDICATIONS STARTED DURING THIS VISIT:  Current Discharge Medication List       Note:  This document was prepared using Dragon voice recognition software and may include unintentional dictation errors.    Rudene Re, MD 07/27/16 [phone]

## 2016-07-27 NOTE — H&P (Signed)
History and Physical    Beth Odonnell [id number] DOB: 08-28-1950 DOA: 07/27/2016  Referring physician: Dr. Alfred Levins PCP: [facility]  Specialists: none  Chief Complaint: CP, SOB, weakness, and dizziness  HPI: Beth Odonnell is a 66 y.o. female has a past medical history significant for HTN and cardiac arrhythmia now with acute onset CP radiating to jaw with SOB, nausea, and near syncope(weakness and dizziness). Brought to ER where telemetry reveals NSVT. CXR and 1st troponin OK. Started on Amiodarone drip with improvement of sx's. Currently chest pain-free. She is now admitted. Denies hx of MI or CAD. Does have a strong FH of CAD.  Review of Systems: The patient denies anorexia, fever, weight loss,, vision loss, decreased hearing, hoarseness,  syncope, peripheral edema, balance deficits, hemoptysis, abdominal pain, melena, hematochezia, severe indigestion/heartburn, hematuria, incontinence, genital sores, muscle weakness, suspicious skin lesions, transient blindness, difficulty walking, depression, unusual weight change, abnormal bleeding, enlarged lymph nodes, angioedema, and breast masses.   Past Medical History:  Diagnosis Date  . Hypercholesteremia   . Hypertension   . Irregular heart beat    History reviewed. No pertinent surgical history. Social History:  reports that she has been smoking.  She has never used smokeless tobacco. She reports that she does not drink alcohol. Her drug history is not on file.  Allergies  Allergen Reactions  . Penicillins Other (See Comments)    Patient passed out.  Has patient had a PCN reaction causing immediate rash, facial/tongue/throat swelling, SOB or lightheadedness with hypotension: No Has patient had a PCN reaction causing severe rash involving mucus membranes or skin necrosis: No Has patient had a PCN reaction that required hospitalization No Has patient had a PCN reaction occurring within the last 10 years: No If all of  the above answers are "NO", then may proceed with Cephalosporin use.    History reviewed. No pertinent family history.  Prior to Admission medications   Medication Sig Start Date End Date Taking? Authorizing Provider  amLODipine (NORVASC) 10 MG tablet Take 10 mg by mouth daily. 05/06/16  Yes Historical Provider, MD  aspirin EC 81 MG tablet Take 81 mg by mouth daily.   Yes Historical Provider, MD  atenolol (TENORMIN) 50 MG tablet Take 50 mg by mouth daily.   Yes Historical Provider, MD  cetirizine (ZYRTEC) 10 MG tablet Take 10 mg by mouth daily.   Yes Historical Provider, MD  cloNIDine (CATAPRES) 0.3 MG tablet Take 0.3 mg by mouth daily. 07/25/16  Yes Historical Provider, MD  FLUoxetine (PROZAC) 20 MG capsule Take 20 mg by mouth daily.   Yes Historical Provider, MD  lisinopril-hydrochlorothiazide (PRINZIDE,ZESTORETIC) 20-12.5 MG tablet Take 2 tablets by mouth daily.   Yes Historical Provider, MD  pravastatin (PRAVACHOL) 40 MG tablet Take 40 mg by mouth daily.   Yes Historical Provider, MD   Physical Exam: Vitals:   07/27/16 0830 07/27/16 0900 07/27/16 0930 07/27/16 0945  BP: 105/79 115/79 101/60 (!) 89/75  Pulse: 71 66 62 (!) 54  Resp: 18 18 13  (!) 21  Temp:      TempSrc:      SpO2: 99% 99% 99% 100%  Weight:      Height:         General:  No apparent distress, WDWN, [place]/AT  Eyes: PERRL, EOMI, no scleral icterus, conjunctiva clear  ENT: moist oropharynx without exudate, TM's benign, dentition good  Neck: supple, no lymphadenopathy. No bruits or thyromegaly  Cardiovascular: regular rate with frequent premature beats without MRG; 2+  peripheral pulses, no JVD, no peripheral edema  Respiratory: CTA biL, good air movement without wheezing, rhonchi or crackled. Respiratory effort normal  Abdomen: soft, non tender to palpation, positive bowel sounds, no guarding, no rebound  Skin: no rashes or lesions  Musculoskeletal: normal bulk and tone, no joint swelling  Psychiatric: normal  mood and affect, A&OX3  Neurologic: CN 2-12 grossly intact, Motor strength 5/5 in all 4 groups with symmetric DTR's and non-focal sensory exam  Labs on Admission:  Basic Metabolic Panel:  Recent Labs Lab 07/27/16 0841  NA 142  K 3.1*  CL 106  CO2 28  GLUCOSE 153*  BUN 19  CREATININE 0.86  CALCIUM 8.9  MG 2.4   Liver Function Tests:  Recent Labs Lab 07/27/16 0841  AST 30  ALT 34  ALKPHOS 54  BILITOT 0.2*  PROT 7.0  ALBUMIN 3.8   No results for input(s): LIPASE, AMYLASE in the last 168 hours. No results for input(s): AMMONIA in the last 168 hours. CBC:  Recent Labs Lab 07/27/16 0841  WBC 6.3  NEUTROABS 3.0  HGB 13.5  HCT 40.5  MCV 84.3  PLT 223   Cardiac Enzymes:  Recent Labs Lab 07/27/16 0841  TROPONINI <0.03    BNP (last 3 results)  Recent Labs  07/27/16 0841  BNP 188.0*    ProBNP (last 3 results) No results for input(s): PROBNP in the last 8760 hours.  CBG: No results for input(s): GLUCAP in the last 168 hours.  Radiological Exams on Admission: Dg Chest Port 1 View  Result Date: 07/27/2016 CLINICAL DATA:  Worsening chest pain. Palpitations. Dizziness and shortness of breath. EXAM: PORTABLE CHEST 1 VIEW COMPARISON:  03/01/2008 FINDINGS: Pacemaker/ defibrillator is overlie the chest. Heart size is normal. There is aortic atherosclerosis. Lungs are clear. The vascularity is normal. No effusions. IMPRESSION: No active disease.  Overlying devices.  Aortic atherosclerosis. Electronically Signed   By: Nelson Chimes M.D.   On: 07/27/2016 07:51    EKG: Independently reviewed.  Assessment/Plan Principal Problem:   Nonsustained ventricular tachycardia (HCC) Active Problems:   Chest pain   Hypokalemia   Near syncope   Will admit to stepdown on Amiodarone drip and add po lopressor and NTP. Follow enzymes. Order echo and Cardiology consult. Repeat labs in AM. Wean off O2 as tolerated  Diet: clear liquids Fluids: NS@100  with K+ DVT  Prophylaxis: Lovenox  Code Status: FULL  Family Communication: yes  Disposition Plan: home  Time spent: 55 min

## 2016-07-27 NOTE — ED Triage Notes (Signed)
Pt c/o chest pain and shortness of breath starting on past Thursday. Pt states worsening over time with intermittent pain. Pt c/o dizziness and shortness of breath. Pt denies reproducible pain on central chest, radiating to L neck, jaw, and back. Pt's mother passed away after heart attack.

## 2016-07-27 NOTE — Progress Notes (Signed)
  Amiodarone Drug - Drug Interaction Consult Note  Recommendations: Current medications reviewed but pt pending admission. Will need to re-review meds when admission orders processed.     Amiodarone is metabolized by the cytochrome P450 system and therefore has the potential to cause many drug interactions. Amiodarone has an average plasma half-life of 50 days (range 20 to 100 days).   There is potential for drug interactions to occur several weeks or months after stopping treatment and the onset of drug interactions may be slow after initiating amiodarone.   []  Statins: Increased risk of myopathy. Simvastatin- restrict dose to 20mg  daily. Other statins: counsel patients to report any muscle pain or weakness immediately.  []  Anticoagulants: Amiodarone can increase anticoagulant effect. Consider warfarin dose reduction. Patients should be monitored closely and the dose of anticoagulant altered accordingly, remembering that amiodarone levels take several weeks to stabilize.  []  Antiepileptics: Amiodarone can increase plasma concentration of phenytoin, the dose should be reduced. Note that small changes in phenytoin dose can result in large changes in levels. Monitor patient and counsel on signs of toxicity.  []  Beta blockers: increased risk of bradycardia, AV block and myocardial depression. Sotalol - avoid concomitant use.  []   Calcium channel blockers (diltiazem and verapamil): increased risk of bradycardia, AV block and myocardial depression.  []   Cyclosporine: Amiodarone increases levels of cyclosporine. Reduced dose of cyclosporine is recommended.  []  Digoxin dose should be halved when amiodarone is started.  []  Diuretics: increased risk of cardiotoxicity if hypokalemia occurs.  []  Oral hypoglycemic agents (glyburide, glipizide, glimepiride): increased risk of hypoglycemia. Patient's glucose levels should be monitored closely when initiating amiodarone therapy.   []  Drugs that  prolong the QT interval:  Torsades de pointes risk may be increased with concurrent use - avoid if possible.  Monitor QTc, also keep magnesium/potassium WNL if concurrent therapy can't be avoided. Marland Kitchen Antibiotics: e.g. fluoroquinolones, erythromycin. . Antiarrhythmics: e.g. quinidine, procainamide, disopyramide, sotalol. . Antipsychotics: e.g. phenothiazines, haloperidol.  . Lithium, tricyclic antidepressants, and methadone.  Thank You,  Rocky Morel  07/27/2016 10:09 AM

## 2016-07-28 ENCOUNTER — Inpatient Hospital Stay
Admit: 2016-07-28 | Discharge: 2016-07-28 | Disposition: A | Discharge status: Home / Self Care | Payer: BLUE CROSS/BLUE SHIELD | Attending: Internal Medicine | Admitting: Internal Medicine

## 2016-07-28 ENCOUNTER — Encounter: Payer: Self-pay | Admitting: Internal Medicine

## 2016-07-28 ENCOUNTER — Encounter
Admission: EM | Disposition: A | Discharge status: Home / Self Care | Payer: Self-pay | Source: Home / Self Care | Attending: Internal Medicine

## 2016-07-28 HISTORY — PX: CARDIAC CATHETERIZATION: SHX172

## 2016-07-28 LAB — COMPREHENSIVE METABOLIC PANEL
ALT: 47 U/L (ref 14–54)
AST: 45 U/L — AB (ref 15–41)
Albumin: 3.9 g/dL (ref 3.5–5.0)
Alkaline Phosphatase: 54 U/L (ref 38–126)
Anion gap: 7 (ref 5–15)
BUN: 6 mg/dL (ref 6–20)
CHLORIDE: 109 mmol/L (ref 101–111)
CO2: 24 mmol/L (ref 22–32)
CREATININE: 0.57 mg/dL (ref 0.44–1.00)
Calcium: 8.5 mg/dL — ABNORMAL LOW (ref 8.9–10.3)
GFR calc non Af Amer: >60 mL/min (ref >60)
Glucose, Bld: 131 mg/dL — ABNORMAL HIGH (ref 65–99)
POTASSIUM: 3.5 mmol/L (ref 3.5–5.1)
SODIUM: 140 mmol/L (ref 135–145)
Total Bilirubin: 0.6 mg/dL (ref 0.3–1.2)
Total Protein: 6.9 g/dL (ref 6.5–8.1)

## 2016-07-28 LAB — CARDIAC CATHETERIZATION: CATHEFQUANT: 60 %

## 2016-07-28 LAB — CBC
HEMATOCRIT: 38.4 % (ref 35.0–47.0)
HEMOGLOBIN: 13.1 g/dL (ref 12.0–16.0)
MCH: 29.1 pg (ref 26.0–34.0)
MCHC: 34.1 g/dL (ref 32.0–36.0)
MCV: 85.4 fL (ref 80.0–100.0)
PLATELETS: 225 10*3/uL (ref 150–440)
RBC: 4.5 MIL/uL (ref 3.80–5.20)
RDW: 14.2 % (ref 11.5–14.5)
WBC: 10 10*3/uL (ref 3.6–11.0)

## 2016-07-28 LAB — TROPONIN I
Troponin I: <0.03 ng/mL (ref <0.03)
Troponin I: <0.03 ng/mL (ref <0.03)
Troponin I: <0.03 ng/mL (ref <0.03)

## 2016-07-28 LAB — GLUCOSE, CAPILLARY: GLUCOSE-CAPILLARY: 130 mg/dL — AB (ref 65–99)

## 2016-07-28 SURGERY — LEFT HEART CATH AND CORONARY ANGIOGRAPHY
Anesthesia: Moderate Sedation

## 2016-07-28 MED ORDER — MIDAZOLAM HCL 2 MG/2ML IJ SOLN
INTRAMUSCULAR | Status: DC | PRN
  Administered 2016-07-28: 1 mg via INTRAVENOUS

## 2016-07-28 MED ORDER — MIDAZOLAM HCL 2 MG/2ML IJ SOLN
INTRAMUSCULAR | Status: AC
  Filled 2016-07-28: qty 2

## 2016-07-28 MED ORDER — IOPAMIDOL (ISOVUE-300) INJECTION 61%
INTRAVENOUS | Status: DC | PRN
  Administered 2016-07-28: 130 mL via INTRA_ARTERIAL

## 2016-07-28 MED ORDER — HEPARIN (PORCINE) IN NACL 2-0.9 UNIT/ML-% IJ SOLN
INTRAMUSCULAR | Status: AC
  Filled 2016-07-28: qty 1500

## 2016-07-28 MED ORDER — FENTANYL CITRATE (PF) 100 MCG/2ML IJ SOLN
INTRAMUSCULAR | Status: DC | PRN
  Administered 2016-07-28: 25 ug via INTRAVENOUS

## 2016-07-28 MED ORDER — FENTANYL CITRATE (PF) 100 MCG/2ML IJ SOLN
INTRAMUSCULAR | Status: AC
  Filled 2016-07-28: qty 2

## 2016-07-28 MED ORDER — HYALURONIDASE HUMAN 150 UNIT/ML IJ SOLN
150.0000 [IU] | Freq: Once | INTRAMUSCULAR | Status: AC
  Administered 2016-07-28: 150 [IU] via SUBCUTANEOUS
  Filled 2016-07-28: qty 1

## 2016-07-28 MED ORDER — POTASSIUM CHLORIDE CRYS ER 20 MEQ PO TBCR
40.0000 meq | EXTENDED_RELEASE_TABLET | Freq: Once | ORAL | Status: AC
  Administered 2016-07-28: 40 meq via ORAL
  Filled 2016-07-28: qty 2

## 2016-07-28 MED ORDER — AMIODARONE HCL 200 MG PO TABS
200.0000 mg | ORAL_TABLET | Freq: Every day | ORAL | Status: DC
  Administered 2016-07-28 – 2016-07-29 (×2): 200 mg via ORAL
  Filled 2016-07-28 (×2): qty 1

## 2016-07-28 SURGICAL SUPPLY — 9 items
CATH 5FR JR4 DIAGNOSTIC (CATHETERS) ×3 IMPLANT
CATH 5FR PIGTAIL DIAGNOSTIC (CATHETERS) ×2 IMPLANT
CATH INFINITI 5FR JL4 (CATHETERS) ×3 IMPLANT
DEVICE CLOSURE MYNXGRIP 5F (Vascular Products) ×3 IMPLANT
KIT MANI 3VAL PERCEP (MISCELLANEOUS) ×3 IMPLANT
NEEDLE PERC 18GX7CM (NEEDLE) ×3 IMPLANT
PACK CARDIAC CATH (CUSTOM PROCEDURE TRAY) ×3 IMPLANT
SHEATH AVANTI 5FR X 11CM (SHEATH) ×3 IMPLANT
WIRE EMERALD 3MM-J .035X150CM (WIRE) ×3 IMPLANT

## 2016-07-28 NOTE — Progress Notes (Addendum)
Per Dr Anselm Jungling and Dr Clayborn Bigness, transfer to 2A, give oral K and start oral amiodarone, stop IV Amiodarone, recheck K in AM.  Patient stated this AM that she "put myself on a fluid pill because I didn't want to go to [facility])."  MDs informed. Per Dr Clayborn Bigness this could be why her K is low and she was experiencing arrhythmias yesterday.

## 2016-07-28 NOTE — Progress Notes (Signed)
Per Dr Vianne Bulls, ok to DC nitropaste and NS w/ 20 KCl

## 2016-07-28 NOTE — Progress Notes (Signed)
Report to [facility].  Check right groin for bleeding or hematoma.  Patient will be on bedrest for 1 hours post sheath pull---out of bed at 14:35.  Bilateral pulses are 2's DP's.Marland Kitchen

## 2016-07-28 NOTE — Progress Notes (Signed)
Patient to special procedures with transporter.   Per Dr Anselm Jungling ok for RN to allow patient off floor without accompanying.  VSS

## 2016-07-28 NOTE — Progress Notes (Signed)
*  PRELIMINARY RESULTS* Echocardiogram 2D Echocardiogram has been performed.  Beth Odonnell 07/28/2016, 11:30 AM

## 2016-07-29 ENCOUNTER — Inpatient Hospital Stay: Payer: BLUE CROSS/BLUE SHIELD

## 2016-07-29 LAB — HEMOGLOBIN A1C
HEMOGLOBIN A1C: 7.1 % — AB (ref 4.8–5.6)
MEAN PLASMA GLUCOSE: 157 mg/dL

## 2016-07-29 LAB — GLUCOSE, CAPILLARY: Glucose-Capillary: 138 mg/dL — ABNORMAL HIGH (ref 65–99)

## 2016-07-29 LAB — CBC
HCT: 41.9 % (ref 35.0–47.0)
Hemoglobin: 13.8 g/dL (ref 12.0–16.0)
MCH: 27.8 pg (ref 26.0–34.0)
MCHC: 32.9 g/dL (ref 32.0–36.0)
MCV: 84.4 fL (ref 80.0–100.0)
Platelets: 218 10*3/uL (ref 150–440)
RBC: 4.96 MIL/uL (ref 3.80–5.20)
RDW: 13.8 % (ref 11.5–14.5)
WBC: 11.8 10*3/uL — ABNORMAL HIGH (ref 3.6–11.0)

## 2016-07-29 LAB — URINALYSIS, ROUTINE W REFLEX MICROSCOPIC
Bacteria, UA: NONE SEEN
Bilirubin Urine: NEGATIVE
Glucose, UA: NEGATIVE mg/dL
Ketones, ur: NEGATIVE mg/dL
Leukocytes, UA: NEGATIVE
Nitrite: NEGATIVE
Protein, ur: NEGATIVE mg/dL
Specific Gravity, Urine: 1.018 (ref 1.005–1.030)
pH: 6 (ref 5.0–8.0)

## 2016-07-29 LAB — POTASSIUM: Potassium: 3.4 mmol/L — ABNORMAL LOW (ref 3.5–5.1)

## 2016-07-29 MED ORDER — AMIODARONE HCL 200 MG PO TABS: 200.0000 mg | ORAL_TABLET | Freq: Every day | ORAL | 0 refills | Status: DC

## 2016-07-29 MED ORDER — ACETAMINOPHEN 325 MG PO TABS
650.0000 mg | ORAL_TABLET | Freq: Four times a day (QID) | ORAL | Status: DC | PRN
Start: 2016-07-29 — End: 2016-07-29

## 2016-07-29 MED ORDER — METOPROLOL TARTRATE 25 MG PO TABS: 25.0000 mg | ORAL_TABLET | Freq: Two times a day (BID) | ORAL | 0 refills | Status: DC

## 2016-07-29 MED ORDER — POTASSIUM CHLORIDE CRYS ER 20 MEQ PO TBCR
40.0000 meq | EXTENDED_RELEASE_TABLET | Freq: Once | ORAL | Status: AC
Start: 2016-07-29 — End: 2016-07-29
  Administered 2016-07-29: 40 meq via ORAL
  Filled 2016-07-29: qty 2

## 2016-07-29 MED ORDER — ACETAMINOPHEN 650 MG RE SUPP: 650.0000 mg | Freq: Four times a day (QID) | RECTAL | Status: DC | PRN

## 2016-07-29 NOTE — Progress Notes (Signed)
Patient discharged home, given her scrips and DC instructions and smoking cessation education.  Also counseled re heart healthy diet.  IVs discontinued, bleeding controlled.  Swelling in left forearm resolved, [place] negative for DVT, low grade fever controlled with APAP; pt stated she "always reacts like this to a flu shot" (received flu vacc yesterday). Patient opted to walk to visitor's entrance while staff followed with wheelchair.  Will follow up with Dr Clayborn Bigness.

## 2016-07-29 NOTE — Progress Notes (Signed)
[place] [facility] [place] NAME: Beth Odonnell    MR#:  [id number]  DATE OF BIRTH:  Aug 10, 1950  SUBJECTIVE:  CHIEF COMPLAINT:   Chief Complaint  Patient presents with  . Chest Pain  . Shortness of Breath  . Dizziness    Came with dizziness and chest pain. Was in V tech- intermittent and low potassium- given IV amiodarone drip- now in NSR. Also taken for cath- which shows some moderate blockages- suggested medical management. No complains now. REVIEW OF SYSTEMS:  CONSTITUTIONAL: No fever, fatigue or weakness.  EYES: No blurred or double vision.  EARS, NOSE, AND THROAT: No tinnitus or ear pain.  RESPIRATORY: No cough, shortness of breath, wheezing or hemoptysis.  CARDIOVASCULAR: No chest pain, orthopnea, edema.  GASTROINTESTINAL: No nausea, vomiting, diarrhea or abdominal pain.  GENITOURINARY: No dysuria, hematuria.  ENDOCRINE: No polyuria, nocturia,  HEMATOLOGY: No anemia, easy bruising or bleeding SKIN: No rash or lesion. MUSCULOSKELETAL: No joint pain or arthritis.   NEUROLOGIC: No tingling, numbness, weakness.  PSYCHIATRY: No anxiety or depression.   ROS  DRUG ALLERGIES:   Allergies  Allergen Reactions  . Penicillins Other (See Comments)    Patient passed out.  Has patient had a PCN reaction causing immediate rash, facial/tongue/throat swelling, SOB or lightheadedness with hypotension: No Has patient had a PCN reaction causing severe rash involving mucus membranes or skin necrosis: No Has patient had a PCN reaction that required hospitalization No Has patient had a PCN reaction occurring within the last 10 years: No If all of the above answers are "NO", then may proceed with Cephalosporin use.    VITALS:  Blood pressure 131/77, pulse 88, temperature 98.6 F (37 C), temperature source Oral, resp. rate 18, height 5\' 11"  (1.803 m), weight 94 kg (207 lb 3.7 oz), SpO2 92 %.  PHYSICAL EXAMINATION:  GENERAL:  66 y.o.-year-old patient  lying in the bed with no acute distress.  EYES: Pupils equal, round, reactive to light and accommodation. No scleral icterus. Extraocular muscles intact.  HEENT: Head atraumatic, normocephalic. Oropharynx and nasopharynx clear.  NECK:  Supple, no jugular venous distention. No thyroid enlargement, no tenderness.  LUNGS: Normal breath sounds bilaterally, no wheezing, rales,rhonchi or crepitation. No use of accessory muscles of respiration.  CARDIOVASCULAR: S1, S2 normal. No murmurs, rubs, or gallops.  ABDOMEN: Soft, nontender, nondistended. Bowel sounds present. No organomegaly or mass.  EXTREMITIES: No pedal edema, cyanosis, or clubbing.  NEUROLOGIC: Cranial nerves II through XII are intact. Muscle strength 5/5 in all extremities. Sensation intact. Gait not checked.  PSYCHIATRIC: The patient is alert and oriented x 3.  SKIN: No obvious rash, lesion, or ulcer.   Physical Exam LABORATORY PANEL:   CBC  Recent Labs Lab 07/28/16 0618  WBC 10.0  HGB 13.1  HCT 38.4  PLT 225   ------------------------------------------------------------------------------------------------------------------  Chemistries   Recent Labs Lab 07/27/16 0841 07/28/16 0618 07/29/16 0546  NA 142 140  --   K 3.1* 3.5 3.4*  CL 106 109  --   CO2 28 24  --   GLUCOSE 153* 131*  --   BUN 19 6  --   CREATININE 0.86 0.57  --   CALCIUM 8.9 8.5*  --   MG 2.4  --   --   AST 30 45*  --   ALT 34 47  --   ALKPHOS 54 54  --   BILITOT 0.2* 0.6  --    ------------------------------------------------------------------------------------------------------------------  Cardiac Enzymes  Recent  Labs Lab 07/28/16 0618 07/28/16 1133  TROPONINI <0.03 <0.03   ------------------------------------------------------------------------------------------------------------------  RADIOLOGY:  No results found.  ASSESSMENT AND PLAN:   Principal Problem:   Nonsustained ventricular tachycardia (HCC) Active Problems:    Chest pain   Hypokalemia   Near syncope   NSVT (nonsustained ventricular tachycardia) (HCC)  * Non sustained V tech   Likely due to Hypokalemia   IV amiodarone drip.   Start oral now, and try to taper drip.   Replace potassium.   Appreciated cardio help.  * CAD   Cath done- moderate disease- medical management   Aspirin, metoprolol, statin.  * hyperglycemia   HBA1c is >7   Advised diet control  * Hyperlipidemia   Statin.    All the records are reviewed and case discussed with Care Management/Social Workerr. Management plans discussed with the patient, family and they are in agreement.  CODE STATUS: Full  TOTAL TIME TAKING CARE OF THIS PATIENT: 35 minutes.     POSSIBLE D/C IN 1-2 DAYS, DEPENDING ON CLINICAL CONDITION.   Vaughan Basta M.D on 07/29/2016   Between 7am to 6pm - Pager - [phone]  After 6pm go to [email or web address] - password [email or web address] [facility]  Office  [phone]  CC: Primary care physician; [facility]  Note: This dictation was prepared with Dragon dictation along with smaller phrase technology. Any transcriptional errors that result from this process are unintentional.

## 2016-07-29 NOTE — Progress Notes (Signed)
Chaplain was making his rounds and visited with pt in [place]. Pt was in good spirits and was awaiting discharge. Provided the ministry of prayer.    07/29/16 1135  Clinical Encounter Type  Visited With Patient  Visit Type Initial;Spiritual support  Referral From Nurse  Spiritual Encounters  Spiritual Needs Prayer

## 2016-07-29 NOTE — Progress Notes (Signed)
MD notified of swelling in LUE, amiodarone infiltrated yesterday in two different veins before being DCd.  [place] ordered

## 2016-07-29 NOTE — Discharge Instructions (Signed)
Follow with cardiology clinic in 2 weeks. °

## 2016-07-29 NOTE — Discharge Summary (Signed)
[facility] NAME: Beth Odonnell    MR#:  [id number]  DATE OF BIRTH:  [birth year]  DATE OF ADMISSION:  07/27/2016 ADMITTING PHYSICIAN: Idelle Crouch, MD  DATE OF DISCHARGE: 07/29/2016  PRIMARY CARE PHYSICIAN: [facility]    ADMISSION DIAGNOSIS:  Ventricular tachycardia ([place]) [I47.2]  DISCHARGE DIAGNOSIS:  Principal Problem:   Nonsustained ventricular tachycardia (HCC) Active Problems:   Chest pain   Hypokalemia   Near syncope   NSVT (nonsustained ventricular tachycardia) ([place])   SECONDARY DIAGNOSIS:   Past Medical History:  Diagnosis Date  . Hypercholesteremia   . Hypertension   . Irregular heart beat     HOSPITAL COURSE:   * Non sustained V tech   Likely due to Hypokalemia   IV amiodarone drip.   Start oral now, and try to taper drip.   Replace potassium.   Appreciated cardio help.   Discharge on oral 20 mg amio daily.   Metoprolol oral started, stopped other BP meds.  * CAD   Cath done- moderate disease- medical management   Aspirin, metoprolol, statin.  * hyperglycemia   HBA1c is >7   Advised diet control  * Hyperlipidemia   Statin.  * low grade fever- 100.5 this morning.  as per her she always have low grade fever after taking flu shot. She received yesterday.   No complains, UA negative.  * left fore arm redness   She have some IV fluid leak sub cut- checked Doppler , no thrombus.   Swelling resolved with ice pack, no pain, no further management.   DISCHARGE CONDITIONS:   Stable.  CONSULTS OBTAINED:  Treatment Team:  Yolonda Kida, MD  DRUG ALLERGIES:   Allergies  Allergen Reactions  . Penicillins Other (See Comments)    Patient passed out.  Has patient had a PCN reaction causing immediate rash, facial/tongue/throat swelling, SOB or lightheadedness with hypotension: No Has patient had a PCN reaction causing severe rash involving mucus membranes or skin  necrosis: No Has patient had a PCN reaction that required hospitalization No Has patient had a PCN reaction occurring within the last 10 years: No If all of the above answers are "NO", then may proceed with Cephalosporin use.    DISCHARGE MEDICATIONS:   Current Discharge Medication List    START taking these medications   Details  amiodarone (PACERONE) 200 MG tablet Take 1 tablet (200 mg total) by mouth daily. Qty: 30 tablet, Refills: 0    metoprolol tartrate (LOPRESSOR) 25 MG tablet Take 1 tablet (25 mg total) by mouth 2 (two) times daily. Qty: 60 tablet, Refills: 0      CONTINUE these medications which have NOT CHANGED   Details  amLODipine (NORVASC) 10 MG tablet Take 10 mg by mouth daily. Refills: 0    aspirin EC 81 MG tablet Take 81 mg by mouth daily.    cetirizine (ZYRTEC) 10 MG tablet Take 10 mg by mouth daily.    FLUoxetine (PROZAC) 20 MG capsule Take 20 mg by mouth daily.    pravastatin (PRAVACHOL) 40 MG tablet Take 40 mg by mouth daily.      STOP taking these medications     atenolol (TENORMIN) 50 MG tablet      cloNIDine (CATAPRES) 0.3 MG tablet      lisinopril-hydrochlorothiazide (PRINZIDE,ZESTORETIC) 20-12.5 MG tablet          DISCHARGE INSTRUCTIONS:    Follow with cardiology clinic in 1-2 weeks.  If you experience worsening of your admission symptoms, develop shortness of breath, life threatening emergency, suicidal or homicidal thoughts you must seek medical attention immediately by calling 911 or calling your MD immediately  if symptoms less severe.  You Must read complete instructions/literature along with all the possible adverse reactions/side effects for all the Medicines you take and that have been prescribed to you. Take any new Medicines after you have completely understood and accept all the possible adverse reactions/side effects.   Please note  You were cared for by a hospitalist during your hospital stay. If you have any questions  about your discharge medications or the care you received while you were in the hospital after you are discharged, you can call the unit and asked to speak with the hospitalist on call if the hospitalist that took care of you is not available. Once you are discharged, your primary care physician will handle any further medical issues. Please note that NO REFILLS for any discharge medications will be authorized once you are discharged, as it is imperative that you return to your primary care physician (or establish a relationship with a primary care physician if you do not have one) for your aftercare needs so that they can reassess your need for medications and monitor your lab values.    Today   CHIEF COMPLAINT:   Chief Complaint  Patient presents with  . Chest Pain  . Shortness of Breath  . Dizziness    HISTORY OF PRESENT ILLNESS:  Beth Odonnell  is a 66 y.o. female with a known history of HTN and cardiac arrhythmia now with acute onset CP radiating to jaw with SOB, nausea, and near syncope(weakness and dizziness). Brought to ER where telemetry reveals NSVT. CXR and 1st troponin OK. Started on Amiodarone drip with improvement of sx's. Currently chest pain-free. She is now admitted. Denies hx of MI or CAD. Does have a strong FH of CAD.  VITAL SIGNS:  Blood pressure (!) 149/79, pulse 71, temperature 99.8 F (37.7 C), temperature source Oral, resp. rate 14, height 5\' 11"  (1.803 m), weight 94 kg (207 lb 3.7 oz), SpO2 100 %.  I/O:   Intake/Output Summary (Last 24 hours) at 07/29/16 1505 Last data filed at 07/29/16 1430  Gross per 24 hour  Intake          1466.67 ml  Output             1425 ml  Net            41.67 ml    PHYSICAL EXAMINATION:  GENERAL:  66 y.o.-year-old patient lying in the bed with no acute distress.  EYES: Pupils equal, round, reactive to light and accommodation. No scleral icterus. Extraocular muscles intact.  HEENT: Head atraumatic, normocephalic. Oropharynx and  nasopharynx clear.  NECK:  Supple, no jugular venous distention. No thyroid enlargement, no tenderness.  LUNGS: Normal breath sounds bilaterally, no wheezing, rales,rhonchi or crepitation. No use of accessory muscles of respiration.  CARDIOVASCULAR: S1, S2 normal. No murmurs, rubs, or gallops.  ABDOMEN: Soft, nontender, nondistended. Bowel sounds present. No organomegaly or mass.  EXTREMITIES: No pedal edema, cyanosis, or clubbing.  NEUROLOGIC: Cranial nerves II through XII are intact. Muscle strength 5/5 in all extremities. Sensation intact. Gait not checked.  PSYCHIATRIC: The patient is alert and oriented x 3.  SKIN: No obvious rash, lesion, or ulcer.    DATA REVIEW:   CBC  Recent Labs Lab 07/29/16 1314  WBC 11.8*  HGB 13.8  HCT 41.9  PLT 218    Chemistries   Recent Labs Lab 07/27/16 0841 07/28/16 0618 07/29/16 0546  NA 142 140  --   K 3.1* 3.5 3.4*  CL 106 109  --   CO2 28 24  --   GLUCOSE 153* 131*  --   BUN 19 6  --   CREATININE 0.86 0.57  --   CALCIUM 8.9 8.5*  --   MG 2.4  --   --   AST 30 45*  --   ALT 34 47  --   ALKPHOS 54 54  --   BILITOT 0.2* 0.6  --     Cardiac Enzymes  Recent Labs Lab 07/28/16 1133  TROPONINI <0.03    Microbiology Results  Results for orders placed or performed during the hospital encounter of 07/27/16  MRSA PCR Screening     Status: None   Collection Time: 07/27/16 12:13 PM  Result Value Ref Range Status   MRSA by PCR NEGATIVE NEGATIVE Final    Comment:        The GeneXpert MRSA Assay (FDA approved for NASAL specimens only), is one component of a comprehensive MRSA colonization surveillance program. It is not intended to diagnose MRSA infection nor to guide or monitor treatment for MRSA infections.     RADIOLOGY:  US Venous Img Upper Uni Left  Result Date: 07/29/2016 CLINICAL DATA:  66 year old female with left upper extremity swelling. EXAM: LEFT UPPER EXTREMITY VENOUS DOPPLER ULTRASOUND TECHNIQUE:  Gray-scale sonography with graded compression, as well as color Doppler and duplex ultrasound were performed to evaluate the upper extremity deep venous system from the level of the subclavian vein and including the jugular, axillary, basilic, radial, ulnar and upper cephalic vein. Spectral Doppler was utilized to evaluate flow at rest and with distal augmentation maneuvers. COMPARISON:  None. FINDINGS: Contralateral Subclavian Vein: Respiratory phasicity is normal and symmetric with the symptomatic side. No evidence of thrombus. Normal compressibility. Internal Jugular Vein: No evidence of thrombus. Normal compressibility, respiratory phasicity and response to augmentation. Subclavian Vein: No evidence of thrombus. Normal compressibility, respiratory phasicity and response to augmentation. Axillary Vein: No evidence of thrombus. Normal compressibility, respiratory phasicity and response to augmentation. Cephalic Vein: No evidence of thrombus. Normal compressibility, respiratory phasicity and response to augmentation. Basilic Vein: No evidence of thrombus. Normal compressibility, respiratory phasicity and response to augmentation. Brachial Veins: No evidence of thrombus. Normal compressibility, respiratory phasicity and response to augmentation. Radial Veins: No evidence of thrombus. Normal compressibility, respiratory phasicity and response to augmentation. Ulnar Veins: No evidence of thrombus. Normal compressibility, respiratory phasicity and response to augmentation. Other Findings:  None visualized. IMPRESSION: Sonographic survey of the left upper extremity negative for DVT. Signed, Dulcy Fanny. Earleen [place], DO [facility] Electronically Signed   By: Corrie Mckusick D.O.   On: 07/29/2016 14:04    EKG:   Orders placed or performed during the hospital encounter of 07/27/16  . EKG 12-Lead  . EKG 12-Lead     Management plans discussed with the patient,  family and they are in agreement.  CODE STATUS:     Code Status Orders        Start     Ordered   07/27/16 1344  Full code  Continuous     07/27/16 1343    Code Status History    Date Active Date Inactive Code Status Order ID Comments User Context   This patient has a current code status but  no historical code status.      TOTAL TIME TAKING CARE OF THIS PATIENT: 35 minutes.    Vaughan Basta M.D on 07/29/2016 at 3:05 PM  Between 7am to 6pm - Pager - [phone]  After 6pm go to [email or web address] - password [email or web address] [facility]  Office  [phone]  CC: Primary care physician; [facility]   Note: This dictation was prepared with Dragon dictation along with smaller phrase technology. Any transcriptional errors that result from this process are unintentional.

## 2016-07-30 LAB — ECHOCARDIOGRAM COMPLETE
HEIGHTINCHES: 71 in
Weight: 3477.98 oz

## 2017-06-05 ENCOUNTER — Other Ambulatory Visit: Payer: Self-pay | Admitting: Family Medicine

## 2017-06-05 DIAGNOSIS — Z780 Asymptomatic menopausal state: Secondary | ICD-10-CM

## 2017-06-05 DIAGNOSIS — Z1239 Encounter for other screening for malignant neoplasm of breast: Secondary | ICD-10-CM

## 2017-06-05 DIAGNOSIS — Z1231 Encounter for screening mammogram for malignant neoplasm of breast: Secondary | ICD-10-CM

## 2017-06-16 ENCOUNTER — Other Ambulatory Visit: Payer: Self-pay

## 2017-06-16 ENCOUNTER — Telehealth: Payer: Self-pay

## 2017-06-16 DIAGNOSIS — Z1211 Encounter for screening for malignant neoplasm of colon: Secondary | ICD-10-CM

## 2017-06-16 NOTE — Telephone Encounter (Signed)
Gastroenterology Pre-Procedure Review  Request Date: 07/17/17 Requesting Physician: Dr. Vicente Males  PATIENT REVIEW QUESTIONS: The patient responded to the following health history questions as indicated:    1. Are you having any GI issues? no 2. Do you have a personal history of Polyps? no 3. Do you have a family history of Colon Cancer or Polyps? no 4. Diabetes Mellitus? no 5. Joint replacements in the past 12 months?no 6. Major health problems in the past 3 months?no 7. Any artificial heart valves, MVP, or defibrillator?no    MEDICATIONS & ALLERGIES:    Patient reports the following regarding taking any anticoagulation/antiplatelet therapy:   Plavix, Coumadin, Eliquis, Xarelto, Lovenox, Pradaxa, Brilinta, or Effient? no Aspirin? yes (Baby Aspirin)  Patient confirms/reports the following medications:  Current Outpatient Medications  Medication Sig Dispense Refill  . amiodarone (PACERONE) 200 MG tablet Take 1 tablet (200 mg total) by mouth daily. 30 tablet 0  . amLODipine (NORVASC) 10 MG tablet Take 10 mg by mouth daily.  0  . aspirin EC 81 MG tablet Take 81 mg by mouth daily.    . cetirizine (ZYRTEC) 10 MG tablet Take 10 mg by mouth daily.    . cloNIDine (CATAPRES) 0.3 MG tablet Take 0.3 mg by mouth.    Marland Kitchen FLUoxetine (PROZAC) 20 MG capsule Take 20 mg by mouth daily.    Marland Kitchen lisinopril-hydrochlorothiazide (PRINZIDE,ZESTORETIC) 10-12.5 MG tablet Take by mouth.    . metoprolol tartrate (LOPRESSOR) 25 MG tablet Take 1 tablet (25 mg total) by mouth 2 (two) times daily. 60 tablet 0  . Multiple Vitamin (MULTI-VITAMINS) TABS Take by mouth.    . pravastatin (PRAVACHOL) 40 MG tablet Take 40 mg by mouth daily.     No current facility-administered medications for this visit.     Patient confirms/reports the following allergies:  Allergies  Allergen Reactions  . Penicillins Other (See Comments)    Patient passed out.  Has patient had a PCN reaction causing immediate rash, facial/tongue/throat  swelling, SOB or lightheadedness with hypotension: No Has patient had a PCN reaction causing severe rash involving mucus membranes or skin necrosis: No Has patient had a PCN reaction that required hospitalization No Has patient had a PCN reaction occurring within the last 10 years: No If all of the above answers are "NO", then may proceed with Cephalosporin use. Went to hospital  . Cat Hair Extract     hives    No orders of the defined types were placed in this encounter.   AUTHORIZATION INFORMATION Primary Insurance: 1D#: Group #:  Secondary Insurance: 1D#: Group #:  SCHEDULE INFORMATION: Date: 07/17/17   Time: Location:[facility]

## 2017-06-18 ENCOUNTER — Other Ambulatory Visit: Payer: Self-pay

## 2017-07-16 ENCOUNTER — Encounter: Payer: Self-pay | Admitting: Emergency Medicine

## 2017-07-16 ENCOUNTER — Other Ambulatory Visit: Payer: BLUE CROSS/BLUE SHIELD

## 2017-07-17 ENCOUNTER — Ambulatory Visit
Admission: RE | Admit: 2017-07-17 | Payer: BLUE CROSS/BLUE SHIELD | Source: Ambulatory Visit | Admitting: Gastroenterology

## 2017-07-17 ENCOUNTER — Encounter: Admission: RE | Payer: Self-pay | Source: Ambulatory Visit

## 2017-07-17 SURGERY — COLONOSCOPY WITH PROPOFOL
Anesthesia: General

## 2017-07-18 IMAGING — US US EXTREM  UP VENOUS*L*
1 series · 13 of 24 positions shown · non-contrast
Comparison: None.

CLINICAL DATA: 65-year-old female with left upper extremity
swelling.



[Series 1: us extrem up venous*left* · 0.09mm/px · 13 of 36 slices shown]
[im 1/36]
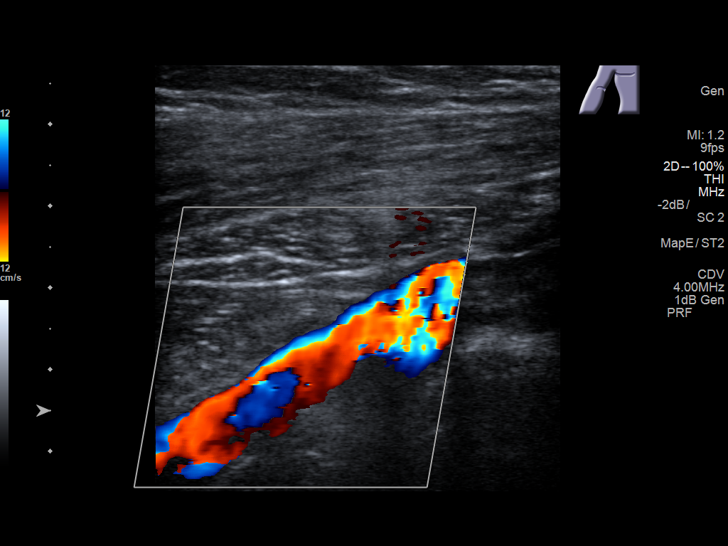
[im 4/36]
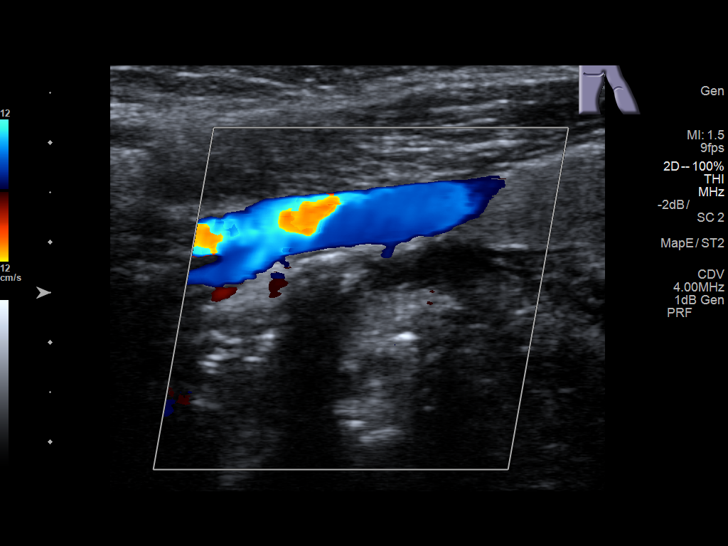
[im 7/36]
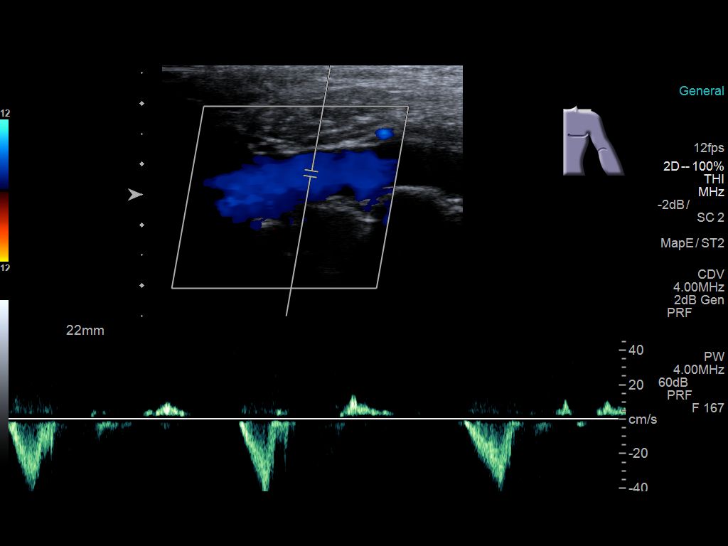
[im 10/36]
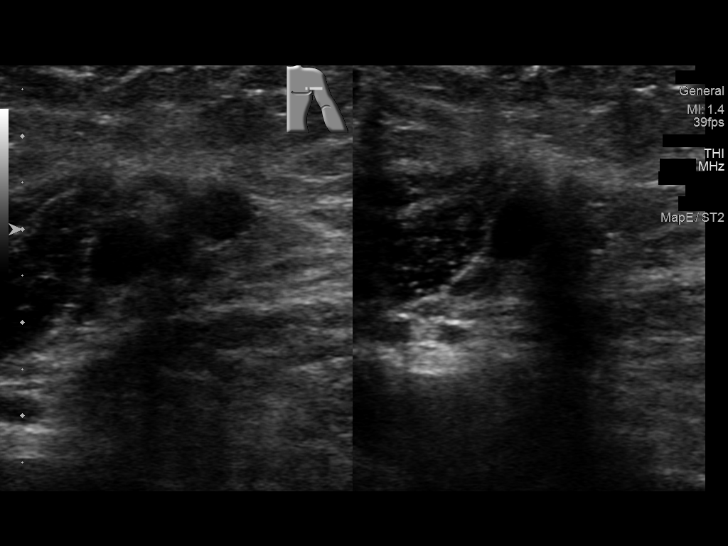
[im 13/36]
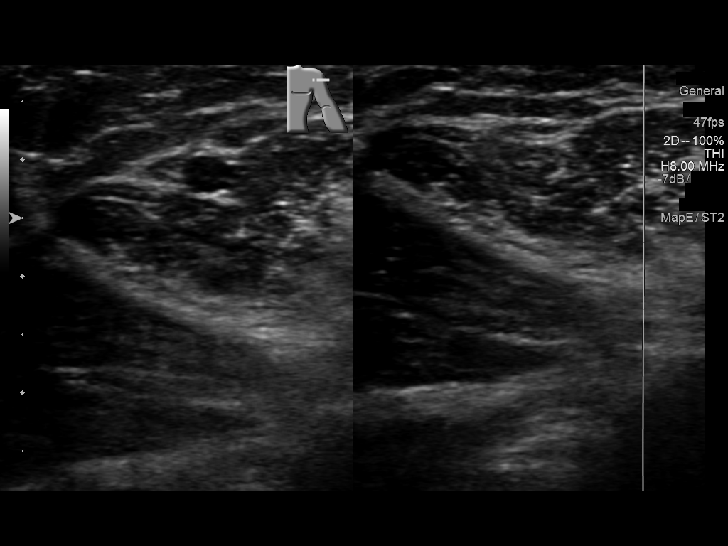
[im 16/36]
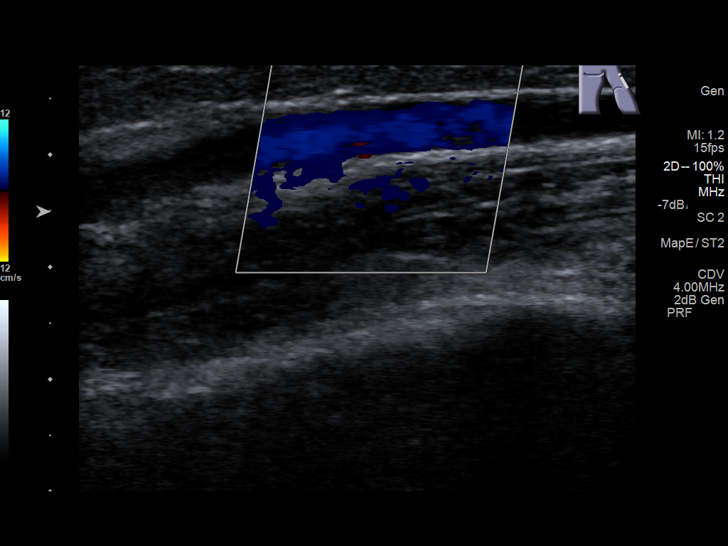
[im 19/36]
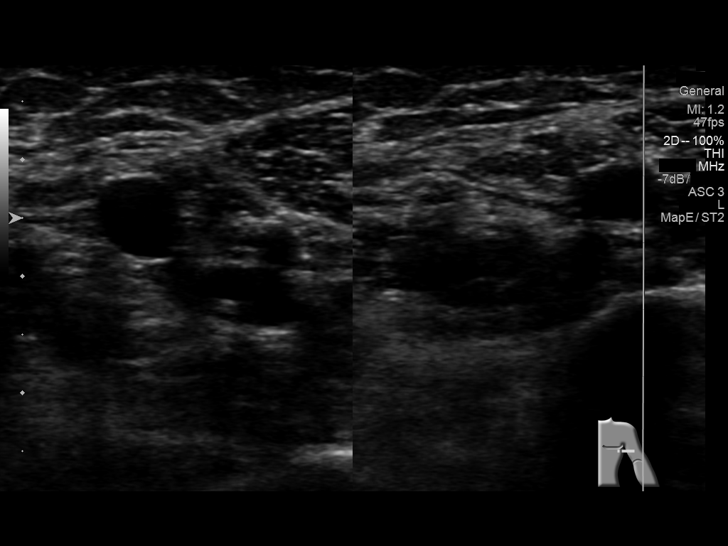
[im 20/36]
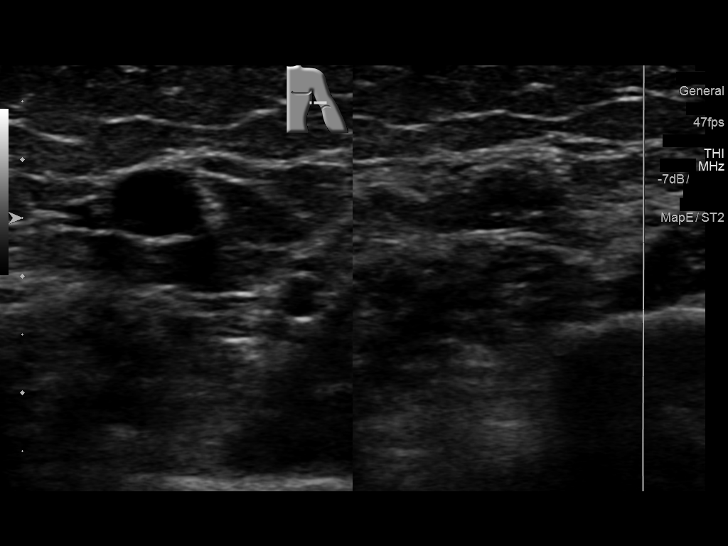
[im 23/36]
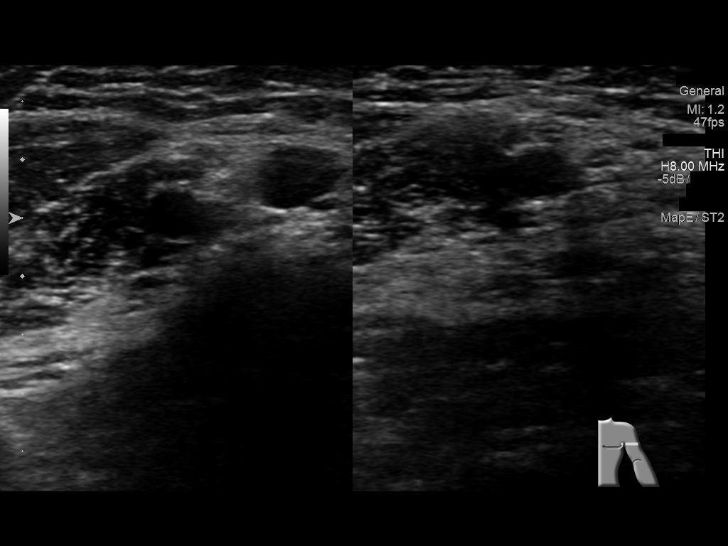
[im 26/36]
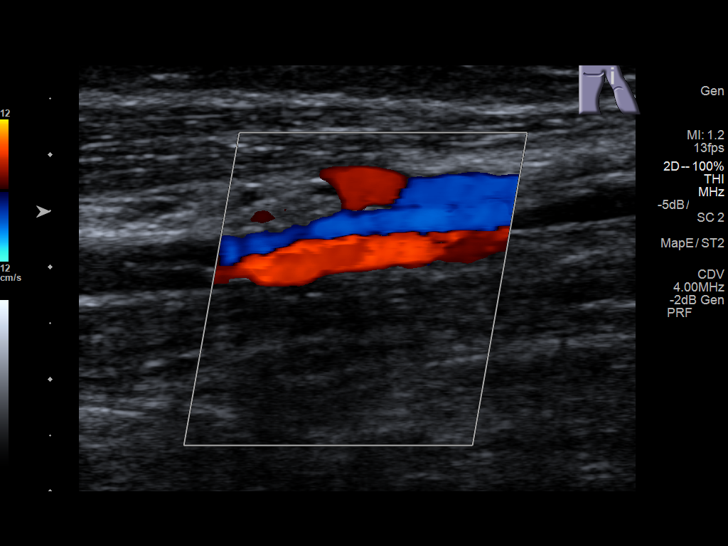
[im 29/36]
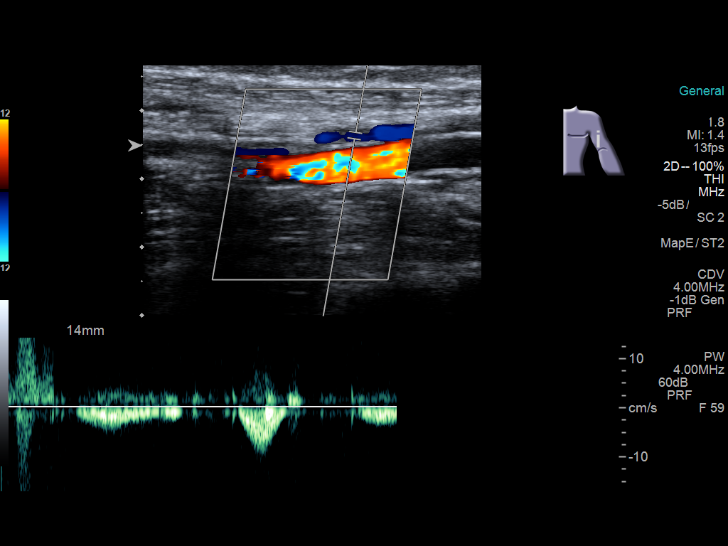
[im 32/36]
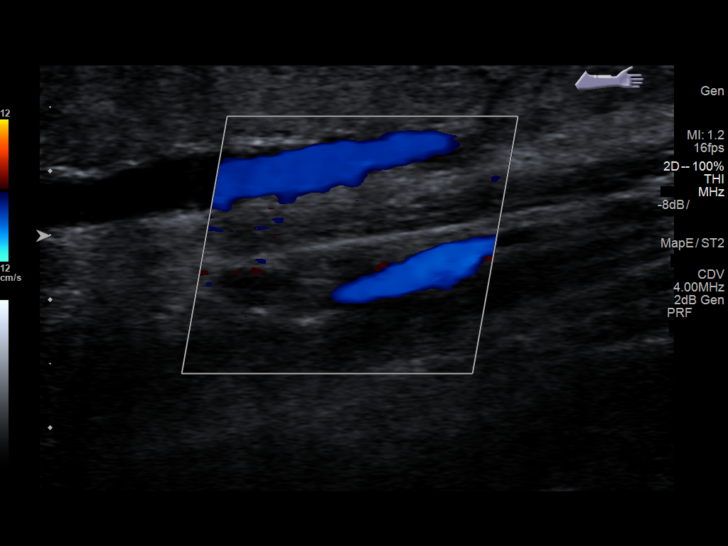
[im 36/36]
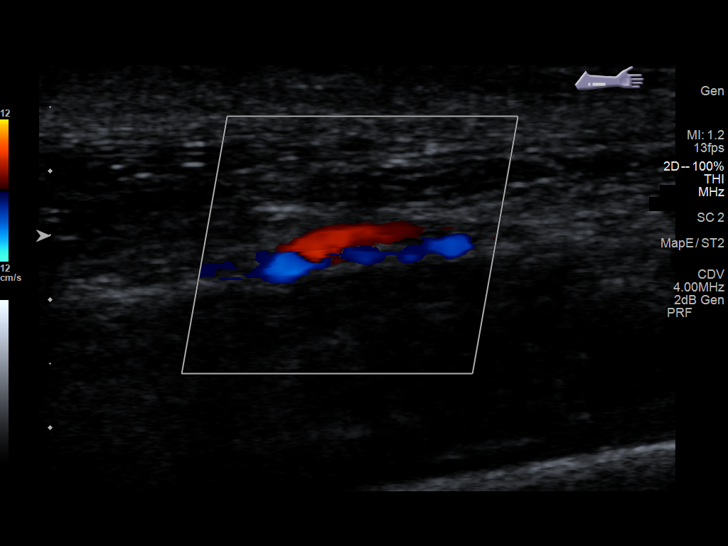

[13 of 24 positions shown; findings below may reference images not displayed]

FINDINGS: Contralateral Subclavian Vein: Respiratory phasicity is normal and
symmetric with the symptomatic side. No evidence of thrombus. Normal
compressibility.

Internal Jugular Vein: No evidence of thrombus. Normal
compressibility, respiratory phasicity and response to augmentation.

Subclavian Vein: No evidence of thrombus. Normal compressibility,
respiratory phasicity and response to augmentation.

Axillary Vein: No evidence of thrombus. Normal compressibility,
respiratory phasicity and response to augmentation.

Cephalic Vein: No evidence of thrombus. Normal compressibility,
respiratory phasicity and response to augmentation.

Basilic Vein: No evidence of thrombus. Normal compressibility,
respiratory phasicity and response to augmentation.

Brachial Veins: No evidence of thrombus. Normal compressibility,
respiratory phasicity and response to augmentation.

Radial Veins: No evidence of thrombus. Normal compressibility,
respiratory phasicity and response to augmentation.

Ulnar Veins: No evidence of thrombus. Normal compressibility,
respiratory phasicity and response to augmentation.

Other Findings:  None visualized.
IMPRESSION: Sonographic survey of the left upper extremity negative for DVT.

## 2018-04-14 ENCOUNTER — Encounter: Payer: Self-pay | Admitting: Family Medicine

## 2018-04-20 ENCOUNTER — Telehealth: Payer: Self-pay | Admitting: Gastroenterology

## 2018-04-20 ENCOUNTER — Other Ambulatory Visit: Payer: Self-pay

## 2018-04-20 DIAGNOSIS — Z1211 Encounter for screening for malignant neoplasm of colon: Secondary | ICD-10-CM

## 2018-04-20 NOTE — Telephone Encounter (Signed)
Gastroenterology Pre-Procedure Review  Request Date: 05/28/18 Requesting Physician: Dr. Vicente Males  PATIENT REVIEW QUESTIONS: The patient responded to the following health history questions as indicated:    1. Are you having any GI issues? no 2. Do you have a personal history of Polyps? no 3. Do you have a family history of Colon Cancer or Polyps? no 4. Diabetes Mellitus? no 5. Joint replacements in the past 12 months?no 6. Major health problems in the past 3 months?no 7. Any artificial heart valves, MVP, or defibrillator?no    MEDICATIONS & ALLERGIES:    Patient reports the following regarding taking any anticoagulation/antiplatelet therapy:   Plavix, Coumadin, Eliquis, Xarelto, Lovenox, Pradaxa, Brilinta, or Effient? no Aspirin? yes (81 mg)  Patient confirms/reports the following medications:  Current Outpatient Medications  Medication Sig Dispense Refill  . amiodarone (PACERONE) 200 MG tablet Take 1 tablet (200 mg total) by mouth daily. 30 tablet 0  . amLODipine (NORVASC) 10 MG tablet Take 10 mg by mouth daily.  0  . aspirin EC 81 MG tablet Take 81 mg by mouth daily.    . cetirizine (ZYRTEC) 10 MG tablet Take 10 mg by mouth daily.    . cloNIDine (CATAPRES) 0.3 MG tablet Take 0.3 mg by mouth.    Marland Kitchen FLUoxetine (PROZAC) 20 MG capsule Take 20 mg by mouth daily.    Marland Kitchen lisinopril-hydrochlorothiazide (PRINZIDE,ZESTORETIC) 10-12.5 MG tablet Take by mouth.    . metoprolol tartrate (LOPRESSOR) 25 MG tablet Take 1 tablet (25 mg total) by mouth 2 (two) times daily. 60 tablet 0  . Multiple Vitamin (MULTI-VITAMINS) TABS Take by mouth.    . pravastatin (PRAVACHOL) 40 MG tablet Take 40 mg by mouth daily.     No current facility-administered medications for this visit.     Patient confirms/reports the following allergies:  Allergies  Allergen Reactions  . Penicillins Other (See Comments)    Patient passed out.  Has patient had a PCN reaction causing immediate rash, facial/tongue/throat swelling,  SOB or lightheadedness with hypotension: No Has patient had a PCN reaction causing severe rash involving mucus membranes or skin necrosis: No Has patient had a PCN reaction that required hospitalization No Has patient had a PCN reaction occurring within the last 10 years: No If all of the above answers are "NO", then may proceed with Cephalosporin use. Went to hospital  . Cat Hair Extract     hives    No orders of the defined types were placed in this encounter.   AUTHORIZATION INFORMATION Primary Insurance: 1D#: Group #:  Secondary Insurance: 1D#: Group #:  SCHEDULE INFORMATION: Date: 05/28/18 Time:  Location: [facility]

## 2018-04-23 ENCOUNTER — Telehealth: Payer: Self-pay | Admitting: Gastroenterology

## 2018-04-23 NOTE — Telephone Encounter (Signed)
Patient called and gave [employer]  Information [phone] grp [phone]) has Medicare part A only per patient. I have entered in the insurance information,however I'm unable to resent insurance to correct the mismatch until patient comes in.

## 2018-05-28 ENCOUNTER — Ambulatory Visit: Payer: BLUE CROSS/BLUE SHIELD | Admitting: Certified Registered Nurse Anesthetist

## 2018-05-28 ENCOUNTER — Encounter: Payer: Self-pay | Admitting: Certified Registered Nurse Anesthetist

## 2018-05-28 ENCOUNTER — Encounter
Admission: RE | Disposition: A | Discharge status: Home / Self Care | Payer: Self-pay | Source: Ambulatory Visit | Attending: Gastroenterology

## 2018-05-28 ENCOUNTER — Ambulatory Visit
Admission: RE | Admit: 2018-05-28 | Discharge: 2018-05-28 | Disposition: A | Discharge status: Home / Self Care | Payer: BLUE CROSS/BLUE SHIELD | Source: Ambulatory Visit | Attending: Gastroenterology | Admitting: Gastroenterology

## 2018-05-28 DIAGNOSIS — D124 Benign neoplasm of descending colon: Secondary | ICD-10-CM

## 2018-05-28 DIAGNOSIS — F17200 Nicotine dependence, unspecified, uncomplicated: Secondary | ICD-10-CM | POA: Insufficient documentation

## 2018-05-28 DIAGNOSIS — Z1211 Encounter for screening for malignant neoplasm of colon: Secondary | ICD-10-CM | POA: Diagnosis present

## 2018-05-28 DIAGNOSIS — Z79899 Other long term (current) drug therapy: Secondary | ICD-10-CM | POA: Diagnosis not present

## 2018-05-28 DIAGNOSIS — D125 Benign neoplasm of sigmoid colon: Secondary | ICD-10-CM

## 2018-05-28 DIAGNOSIS — K5521 Angiodysplasia of colon with hemorrhage: Secondary | ICD-10-CM | POA: Insufficient documentation

## 2018-05-28 DIAGNOSIS — I10 Essential (primary) hypertension: Secondary | ICD-10-CM | POA: Insufficient documentation

## 2018-05-28 DIAGNOSIS — Z7982 Long term (current) use of aspirin: Secondary | ICD-10-CM | POA: Diagnosis not present

## 2018-05-28 DIAGNOSIS — E7800 Pure hypercholesterolemia, unspecified: Secondary | ICD-10-CM | POA: Diagnosis not present

## 2018-05-28 DIAGNOSIS — D122 Benign neoplasm of ascending colon: Secondary | ICD-10-CM | POA: Insufficient documentation

## 2018-05-28 HISTORY — PX: COLONOSCOPY WITH PROPOFOL: SHX5780

## 2018-05-28 SURGERY — COLONOSCOPY WITH PROPOFOL
Anesthesia: General

## 2018-05-28 MED ORDER — LIDOCAINE HCL (PF) 1 % IJ SOLN
2.0000 mL | Freq: Once | INTRAMUSCULAR | Status: AC
  Administered 2018-05-28: 0.3 mL via INTRADERMAL

## 2018-05-28 MED ORDER — PROPOFOL 10 MG/ML IV BOLUS
INTRAVENOUS | Status: AC
Start: 2018-05-28 — End: 2018-05-28
  Filled 2018-05-28: qty 20

## 2018-05-28 MED ORDER — SODIUM CHLORIDE 0.9 % IV SOLN
INTRAVENOUS | Status: DC
  Administered 2018-05-28: 08:00:00 via INTRAVENOUS
  Administered 2018-05-28: 1000 mL via INTRAVENOUS

## 2018-05-28 MED ORDER — PROPOFOL 10 MG/ML IV BOLUS
INTRAVENOUS | Status: DC | PRN
  Administered 2018-05-28 (×2): 20 mg via INTRAVENOUS

## 2018-05-28 MED ORDER — LIDOCAINE HCL (CARDIAC) PF 100 MG/5ML IV SOSY
PREFILLED_SYRINGE | INTRAVENOUS | Status: DC | PRN
  Administered 2018-05-28: 50 mg via INTRAVENOUS

## 2018-05-28 MED ORDER — LIDOCAINE HCL (PF) 1 % IJ SOLN
INTRAMUSCULAR | Status: AC
  Administered 2018-05-28: 0.3 mL via INTRADERMAL
  Filled 2018-05-28: qty 2

## 2018-05-28 MED ORDER — PROPOFOL 500 MG/50ML IV EMUL
INTRAVENOUS | Status: DC | PRN
  Administered 2018-05-28: 85 ug/kg/min via INTRAVENOUS

## 2018-05-28 MED ORDER — PROPOFOL 500 MG/50ML IV EMUL
INTRAVENOUS | Status: AC
  Filled 2018-05-28: qty 50

## 2018-05-28 NOTE — Op Note (Signed)
[facility] Gastroenterology Patient Name: Beth Odonnell Procedure Date: 05/28/2018 7:17 AM MRN: [id number] Account #: [id number] Date of Birth: 10/15/1950 Admit Type: Outpatient Age: 67 Room: [facility] ENDO ROOM 4 Gender: Female Note Status: Finalized Procedure:            Colonoscopy Indications:          Screening for colorectal malignant neoplasm Providers:            Jonathon Bellows MD, MD Referring MD:         [facility] Medicines:            Monitored Anesthesia Care Complications:        No immediate complications. Procedure:            Pre-Anesthesia Assessment:                       - Prior to the procedure, a History and Physical was                        performed, and patient medications, allergies and                        sensitivities were reviewed. The patient's tolerance of                        previous anesthesia was reviewed.                       - The risks and benefits of the procedure and the                        sedation options and risks were discussed with the                        patient. All questions were answered and informed                        consent was obtained.                       - ASA Grade Assessment: II - A patient with mild                        systemic disease.                       After obtaining informed consent, the colonoscope was                        passed under direct vision. Throughout the procedure,                        the patient's blood pressure, pulse, and oxygen                        saturations were monitored continuously. The                        Colonoscope was introduced through the anus and  advanced to the the cecum, identified by the                        appendiceal orifice, IC valve and transillumination.                        The colonoscopy was performed with ease. The patient                        tolerated the procedure well. The quality of  the bowel                        preparation was good. Findings:      The perianal and digital rectal examinations were normal.      A 6 mm polyp was found in the sigmoid colon. The polyp was sessile. The       polyp was removed with a cold snare. Resection and retrieval were       complete.      Three sessile polyps were found in the descending colon and ascending       colon. The polyps were 4 to 5 mm in size. These polyps were removed with       a cold snare. Resection and retrieval were complete.      Two sessile polyps were found in the ascending colon. The polyps were 3       to 5 mm in size. These polyps were removed with a cold snare. Resection       was complete, but the polyp tissue was not retrieved.      A single large localized angioectasia with bleeding was found in the       cecum. Coagulation for hemostasis using argon plasma at 0.5       liters/minute and 20 watts was successful.      The exam was otherwise without abnormality on direct and retroflexion       views. Impression:           - One 6 mm polyp in the sigmoid colon, removed with a                        cold snare. Resected and retrieved.                       - Three 4 to 5 mm polyps in the descending colon and in                        the ascending colon, removed with a cold snare.                        Resected and retrieved.                       - Two 3 to 5 mm polyps in the ascending colon, removed                        with a cold snare. Complete resection. Polyp tissue not                        retrieved.                       -  A single bleeding colonic angioectasia. Treated with                        argon plasma coagulation (APC).                       - The examination was otherwise normal on direct and                        retroflexion views. Recommendation:       - Discharge patient to home (with spouse).                       - Resume previous diet.                       - Continue  present medications.                       - Await pathology results.                       - Repeat colonoscopy in 3 years for surveillance. Procedure Code(s):    --- Professional ---                       [phone], 59, Colonoscopy, flexible; with control of                        bleeding, any method                       45385, Colonoscopy, flexible; with removal of tumor(s),                        polyp(s), or other lesion(s) by snare technique Diagnosis Code(s):    --- Professional ---                       Z12.11, Encounter for screening for malignant neoplasm                        of colon                       D12.5, Benign neoplasm of sigmoid colon                       D12.4, Benign neoplasm of descending colon                       D12.2, Benign neoplasm of ascending colon                       K55.21, Angiodysplasia of colon with hemorrhage CPT copyright 2018 American Medical Association. All rights reserved. The codes documented in this report are preliminary and upon coder review may  be revised to meet current compliance requirements. Jonathon Bellows, MD Jonathon Bellows MD, MD 05/28/2018 8:13:36 AM This report has been signed electronically. Number of Addenda: 0 Note Initiated On: 05/28/2018 7:17 AM Scope Withdrawal Time: 0 hours 21 minutes 44 seconds  Total Procedure Duration: 0 hours 24 minutes 19 seconds       [facility]

## 2018-05-28 NOTE — Anesthesia Post-op Follow-up Note (Signed)
Anesthesia QCDR form completed.        

## 2018-05-28 NOTE — Transfer of Care (Signed)
Immediate Anesthesia Transfer of Care Note  Patient: Beth Odonnell  Procedure(s) Performed: COLONOSCOPY WITH PROPOFOL (N/A )  Patient Location: PACU  Anesthesia Type:MAC  Level of Consciousness: awake, alert  and oriented  Airway & Oxygen Therapy: Patient Spontanous Breathing and Patient connected to nasal cannula oxygen  Post-op Assessment: Report given to RN and Post -op Vital signs reviewed and stable  Post vital signs: stable  Last Vitals:  Vitals Value Taken Time  BP 108/72 05/28/2018  8:14 AM  Temp 36.2 C 05/28/2018  8:14 AM  Pulse 65 05/28/2018  8:14 AM  Resp 16 05/28/2018  8:14 AM  SpO2 98 % 05/28/2018  8:14 AM    Last Pain:  Vitals:   05/28/18 0814  TempSrc: Tympanic  PainSc: Asleep         Complications: No apparent anesthesia complications

## 2018-05-28 NOTE — H&P (Signed)
Jonathon Bellows, MD [address] [place], [place], [place], [address] [address], [place], [place], [place], [address] Phone: [phone]  Fax: [phone]  Primary Care Physician:  [facility]   Pre-Procedure History & Physical: HPI:  Beth Odonnell is a 67 y.o. female is here for an colonoscopy.   Past Medical History:  Diagnosis Date  . Hypercholesteremia   . Hypertension   . Irregular heart beat     Past Surgical History:  Procedure Laterality Date  . CARDIAC CATHETERIZATION N/A 07/28/2016   Procedure: Left Heart Cath and Coronary Angiography and possible PCI and stent;  Surgeon: Yolonda Kida, MD;  Location: [facility];  Service: Cardiovascular;  Laterality: N/A;    Prior to Admission medications   Medication Sig Start Date End Date Taking? Authorizing Provider  amiodarone (PACERONE) 200 MG tablet Take 1 tablet (200 mg total) by mouth daily. 07/30/16   Vaughan Basta, MD  amLODipine (NORVASC) 10 MG tablet Take 10 mg by mouth daily. 05/06/16   [provider]  aspirin EC 81 MG tablet Take 81 mg by mouth daily.    [provider]  cetirizine (ZYRTEC) 10 MG tablet Take 10 mg by mouth daily.    [provider]  cloNIDine (CATAPRES) 0.3 MG tablet Take 0.3 mg by mouth.    [provider]  FLUoxetine (PROZAC) 20 MG capsule Take 20 mg by mouth daily.    [provider]  lisinopril-hydrochlorothiazide (PRINZIDE,ZESTORETIC) 10-12.5 MG tablet Take by mouth.    [provider]  metoprolol tartrate (LOPRESSOR) 25 MG tablet Take 1 tablet (25 mg total) by mouth 2 (two) times daily. 07/29/16   Vaughan Basta, MD  Multiple Vitamin (MULTI-VITAMINS) TABS Take by mouth.    [provider]  pravastatin (PRAVACHOL) 40 MG tablet Take 40 mg by mouth daily.    [provider]    Allergies as of 04/20/2018 - Review Complete 07/16/2017  Allergen Reaction Noted  .  Penicillins Other (See Comments) 06/10/2013  . Cat hair extract  06/10/2013    History reviewed. No pertinent family history.  Social History   Socioeconomic History  . Marital status: Single    Spouse name: Not on file  . Number of children: Not on file  . Years of education: Not on file  . Highest education level: Not on file  Occupational History  . Not on file  Social Needs  . Financial resource strain: Not on file  . Food insecurity:    Worry: Not on file    Inability: Not on file  . Transportation needs:    Medical: Not on file    Non-medical: Not on file  Tobacco Use  . Smoking status: Current Every Day Smoker  . Smokeless tobacco: Never Used  Substance and Sexual Activity  . Alcohol use: No  . Drug use: Not on file  . Sexual activity: Not on file  Lifestyle  . Physical activity:    Days per week: Not on file    Minutes per session: Not on file  . Stress: Not on file  Relationships  . Social connections:    Talks on phone: Not on file    Gets together: Not on file    Attends religious service: Not on file    Active member of club or organization: Not on file    Attends meetings of clubs or organizations: Not on file    Relationship status: Not on  file  . Intimate partner violence:    Fear of current or ex partner: Not on file    Emotionally abused: Not on file    Physically abused: Not on file    Forced sexual activity: Not on file  Other Topics Concern  . Not on file  Social History Narrative  . Not on file    Review of Systems: See HPI, otherwise negative ROS  Physical Exam: BP 138/84   Pulse 70   Temp (!) 96.7 F (35.9 C) (Tympanic)   Resp 18   Ht 5\' 11"  (1.803 m)   Wt 94 kg   SpO2 98%   BMI 28.90 kg/m  General:   Alert,  pleasant and cooperative in NAD Head:  Normocephalic and atraumatic. Neck:  Supple; no masses or thyromegaly. Lungs:  Clear throughout to auscultation, normal respiratory effort.    Heart:  +S1, +S2, Regular rate and  rhythm, No edema. Abdomen:  Soft, nontender and nondistended. Normal bowel sounds, without guarding, and without rebound.   Neurologic:  Alert and  oriented x4;  grossly normal neurologically.  Impression/Plan: Beth Odonnell is here for an colonoscopy to be performed for Screening colonoscopy average risk   Risks, benefits, limitations, and alternatives regarding  colonoscopy have been reviewed with the patient.  Questions have been answered.  All parties agreeable.   Jonathon Bellows, MD  05/28/2018, 7:38 AM

## 2018-05-28 NOTE — Anesthesia Postprocedure Evaluation (Signed)
Anesthesia Post Note  Patient: Beth Odonnell  Procedure(s) Performed: COLONOSCOPY WITH PROPOFOL (N/A )  Patient location during evaluation: Endoscopy Anesthesia Type: General Level of consciousness: awake and alert and oriented Pain management: pain level controlled Vital Signs Assessment: post-procedure vital signs reviewed and stable Respiratory status: spontaneous breathing, nonlabored ventilation and respiratory function stable Cardiovascular status: blood pressure returned to baseline and stable Postop Assessment: no signs of nausea or vomiting Anesthetic complications: no     Last Vitals:  Vitals:   05/28/18 0834 05/28/18 0844  BP: (!) 145/85 (!) 152/87  Pulse: (!) 58 61  Resp: 12   Temp:    SpO2: 100% 97%    Last Pain:  Vitals:   05/28/18 0844  TempSrc:   PainSc: 0-No pain                 [name]

## 2018-05-28 NOTE — Anesthesia Preprocedure Evaluation (Signed)
Anesthesia Evaluation  Patient identified by MRN, date of birth, ID band Patient awake    Reviewed: Allergy & Precautions, NPO status , Patient's Chart, lab work & pertinent test results  History of Anesthesia Complications Negative for: history of anesthetic complications  Airway Mallampati: II  TM Distance: >3 FB Neck ROM: Full    Dental  (+) Missing, Poor Dentition   Pulmonary neg sleep apnea, neg COPD, Current Smoker,    breath sounds clear to auscultation- rhonchi (-) wheezing      Cardiovascular hypertension, (-) CAD, (-) Past MI, (-) Cardiac Stents and (-) CABG + dysrhythmias (VT in setting of hypokalemia) Ventricular Tachycardia  Rhythm:Regular Rate:Normal - Systolic murmurs and - Diastolic murmurs    Neuro/Psych negative neurological ROS  negative psych ROS   GI/Hepatic negative GI ROS, Neg liver ROS,   Endo/Other  negative endocrine ROSneg diabetes  Renal/GU negative Renal ROS     Musculoskeletal negative musculoskeletal ROS (+)   Abdominal (+) - obese,   Peds  Hematology negative hematology ROS (+)   Anesthesia Other Findings Past Medical History: No date: Hypercholesteremia No date: Hypertension No date: Irregular heart beat   Reproductive/Obstetrics                             Anesthesia Physical Anesthesia Plan  ASA: II  Anesthesia Plan: General   Post-op Pain Management:    Induction: Intravenous  PONV Risk Score and Plan: 1 and Propofol infusion  Airway Management Planned: Natural Airway  Additional Equipment:   Intra-op Plan:   Post-operative Plan:   Informed Consent: I have reviewed the patients History and Physical, chart, labs and discussed the procedure including the risks, benefits and alternatives for the proposed anesthesia with the patient or authorized representative who has indicated his/her understanding and acceptance.   Dental advisory  given  Plan Discussed with: CRNA and Anesthesiologist  Anesthesia Plan Comments:         Anesthesia Quick Evaluation

## 2018-05-31 ENCOUNTER — Encounter: Payer: Self-pay | Admitting: Gastroenterology

## 2018-05-31 LAB — SURGICAL PATHOLOGY

## 2018-06-09 ENCOUNTER — Encounter: Payer: Self-pay | Admitting: Gastroenterology

## 2019-09-05 ENCOUNTER — Other Ambulatory Visit: Payer: Self-pay | Admitting: Family Medicine

## 2019-09-05 ENCOUNTER — Ambulatory Visit
Admission: RE | Admit: 2019-09-05 | Discharge: 2019-09-05 | Disposition: A | Discharge status: Home / Self Care | Payer: BC Managed Care – PPO | Source: Ambulatory Visit | Attending: Family Medicine | Admitting: Family Medicine

## 2019-09-05 ENCOUNTER — Other Ambulatory Visit: Payer: Self-pay

## 2019-09-05 ENCOUNTER — Ambulatory Visit
Admission: RE | Admit: 2019-09-05 | Discharge: 2019-09-05 | Disposition: A | Discharge status: Home / Self Care | Payer: BC Managed Care – PPO | Attending: Family Medicine | Admitting: Family Medicine

## 2019-09-05 DIAGNOSIS — R0602 Shortness of breath: Secondary | ICD-10-CM

## 2019-09-11 ENCOUNTER — Inpatient Hospital Stay
Admission: EM | Admit: 2019-09-11 | Discharge: 2019-09-13 | DRG: 640 | Disposition: A | Discharge status: Home / Self Care | Payer: BC Managed Care – PPO | Attending: Internal Medicine | Admitting: Internal Medicine

## 2019-09-11 ENCOUNTER — Emergency Department: Payer: BC Managed Care – PPO

## 2019-09-11 ENCOUNTER — Other Ambulatory Visit: Payer: Self-pay

## 2019-09-11 DIAGNOSIS — Z9114 Patient's other noncompliance with medication regimen: Secondary | ICD-10-CM

## 2019-09-11 DIAGNOSIS — F17210 Nicotine dependence, cigarettes, uncomplicated: Secondary | ICD-10-CM | POA: Diagnosis present

## 2019-09-11 DIAGNOSIS — Z9111 Patient's noncompliance with dietary regimen: Secondary | ICD-10-CM

## 2019-09-11 DIAGNOSIS — E785 Hyperlipidemia, unspecified: Secondary | ICD-10-CM | POA: Diagnosis present

## 2019-09-11 DIAGNOSIS — Z6832 Body mass index (BMI) 32.0-32.9, adult: Secondary | ICD-10-CM

## 2019-09-11 DIAGNOSIS — Z79899 Other long term (current) drug therapy: Secondary | ICD-10-CM | POA: Diagnosis not present

## 2019-09-11 DIAGNOSIS — Z880 Allergy status to penicillin: Secondary | ICD-10-CM

## 2019-09-11 DIAGNOSIS — Z7982 Long term (current) use of aspirin: Secondary | ICD-10-CM | POA: Diagnosis not present

## 2019-09-11 DIAGNOSIS — I160 Hypertensive urgency: Secondary | ICD-10-CM | POA: Diagnosis present

## 2019-09-11 DIAGNOSIS — I509 Heart failure, unspecified: Secondary | ICD-10-CM

## 2019-09-11 DIAGNOSIS — I4729 Other ventricular tachycardia: Secondary | ICD-10-CM

## 2019-09-11 DIAGNOSIS — J9601 Acute respiratory failure with hypoxia: Secondary | ICD-10-CM | POA: Diagnosis not present

## 2019-09-11 DIAGNOSIS — E669 Obesity, unspecified: Secondary | ICD-10-CM | POA: Diagnosis present

## 2019-09-11 DIAGNOSIS — E8770 Fluid overload, unspecified: Secondary | ICD-10-CM

## 2019-09-11 DIAGNOSIS — Z9109 Other allergy status, other than to drugs and biological substances: Secondary | ICD-10-CM | POA: Diagnosis not present

## 2019-09-11 DIAGNOSIS — I472 Ventricular tachycardia: Secondary | ICD-10-CM | POA: Diagnosis not present

## 2019-09-11 DIAGNOSIS — J9621 Acute and chronic respiratory failure with hypoxia: Secondary | ICD-10-CM | POA: Diagnosis present

## 2019-09-11 DIAGNOSIS — Z20822 Contact with and (suspected) exposure to covid-19: Secondary | ICD-10-CM | POA: Diagnosis present

## 2019-09-11 DIAGNOSIS — E876 Hypokalemia: Secondary | ICD-10-CM | POA: Diagnosis not present

## 2019-09-11 DIAGNOSIS — I2510 Atherosclerotic heart disease of native coronary artery without angina pectoris: Secondary | ICD-10-CM | POA: Diagnosis present

## 2019-09-11 DIAGNOSIS — I10 Essential (primary) hypertension: Secondary | ICD-10-CM | POA: Diagnosis not present

## 2019-09-11 DIAGNOSIS — T502X5A Adverse effect of carbonic-anhydrase inhibitors, benzothiadiazides and other diuretics, initial encounter: Secondary | ICD-10-CM | POA: Diagnosis present

## 2019-09-11 DIAGNOSIS — E1165 Type 2 diabetes mellitus with hyperglycemia: Secondary | ICD-10-CM | POA: Diagnosis present

## 2019-09-11 DIAGNOSIS — J189 Pneumonia, unspecified organism: Secondary | ICD-10-CM

## 2019-09-11 DIAGNOSIS — R0602 Shortness of breath: Secondary | ICD-10-CM | POA: Diagnosis present

## 2019-09-11 DIAGNOSIS — F419 Anxiety disorder, unspecified: Secondary | ICD-10-CM | POA: Diagnosis present

## 2019-09-11 DIAGNOSIS — I5031 Acute diastolic (congestive) heart failure: Secondary | ICD-10-CM | POA: Diagnosis not present

## 2019-09-11 DIAGNOSIS — I340 Nonrheumatic mitral (valve) insufficiency: Secondary | ICD-10-CM | POA: Diagnosis not present

## 2019-09-11 DIAGNOSIS — I110 Hypertensive heart disease with heart failure: Secondary | ICD-10-CM | POA: Diagnosis present

## 2019-09-11 LAB — CBC WITH DIFFERENTIAL/PLATELET
Abs Immature Granulocytes: 0.07 10*3/uL (ref 0.00–0.07)
Basophils Absolute: 0.1 10*3/uL (ref 0.0–0.1)
Basophils Relative: 1 %
Eosinophils Absolute: 0.6 10*3/uL — ABNORMAL HIGH (ref 0.0–0.5)
Eosinophils Relative: 4 %
HCT: 43.6 % (ref 36.0–46.0)
Hemoglobin: 14.4 g/dL (ref 12.0–15.0)
Immature Granulocytes: 1 %
Lymphocytes Relative: 22 %
Lymphs Abs: 2.8 10*3/uL (ref 0.7–4.0)
MCH: 28.3 pg (ref 26.0–34.0)
MCHC: 33 g/dL (ref 30.0–36.0)
MCV: 85.7 fL (ref 80.0–100.0)
Monocytes Absolute: 0.9 10*3/uL (ref 0.1–1.0)
Monocytes Relative: 7 %
Neutro Abs: 8.2 10*3/uL — ABNORMAL HIGH (ref 1.7–7.7)
Neutrophils Relative %: 65 %
Platelets: 300 10*3/uL (ref 150–400)
RBC: 5.09 MIL/uL (ref 3.87–5.11)
RDW: 14.3 % (ref 11.5–15.5)
WBC: 12.5 10*3/uL — ABNORMAL HIGH (ref 4.0–10.5)
nRBC: 0 % (ref 0.0–0.2)

## 2019-09-11 LAB — COMPREHENSIVE METABOLIC PANEL
ALT: 30 U/L (ref 0–44)
AST: 24 U/L (ref 15–41)
Albumin: 4 g/dL (ref 3.5–5.0)
Alkaline Phosphatase: 76 U/L (ref 38–126)
Anion gap: 12 (ref 5–15)
BUN: 15 mg/dL (ref 8–23)
CO2: 29 mmol/L (ref 22–32)
Calcium: 9.1 mg/dL (ref 8.9–10.3)
Chloride: 100 mmol/L (ref 98–111)
Creatinine, Ser: 0.57 mg/dL (ref 0.44–1.00)
GFR calc Af Amer: >60 mL/min (ref >60)
GFR calc non Af Amer: >60 mL/min (ref >60)
Glucose, Bld: 169 mg/dL — ABNORMAL HIGH (ref 70–99)
Potassium: 3.4 mmol/L — ABNORMAL LOW (ref 3.5–5.1)
Sodium: 141 mmol/L (ref 135–145)
Total Bilirubin: 0.5 mg/dL (ref 0.3–1.2)
Total Protein: 8 g/dL (ref 6.5–8.1)

## 2019-09-11 LAB — CBC
HCT: 43.9 % (ref 36.0–46.0)
Hemoglobin: 14.3 g/dL (ref 12.0–15.0)
MCH: 28.3 pg (ref 26.0–34.0)
MCHC: 32.6 g/dL (ref 30.0–36.0)
MCV: 86.8 fL (ref 80.0–100.0)
Platelets: 294 10*3/uL (ref 150–400)
RBC: 5.06 MIL/uL (ref 3.87–5.11)
RDW: 14.5 % (ref 11.5–15.5)
WBC: 11.5 10*3/uL — ABNORMAL HIGH (ref 4.0–10.5)
nRBC: 0 % (ref 0.0–0.2)

## 2019-09-11 LAB — BLOOD GAS, ARTERIAL
Acid-Base Excess: 1.7 mmol/L (ref 0.0–2.0)
Allens test (pass/fail): POSITIVE — AB
Bicarbonate: 27.2 mmol/L (ref 20.0–28.0)
FIO2: 30
O2 Saturation: 91.5 %
Patient temperature: 37
pCO2 arterial: 45 mmHg (ref 32.0–48.0)
pH, Arterial: 7.39 (ref 7.350–7.450)
pO2, Arterial: 63 mmHg — ABNORMAL LOW (ref 83.0–108.0)

## 2019-09-11 LAB — BRAIN NATRIURETIC PEPTIDE: B Natriuretic Peptide: 34 pg/mL (ref 0.0–100.0)

## 2019-09-11 LAB — HIV ANTIBODY (ROUTINE TESTING W REFLEX): HIV Screen 4th Generation wRfx: NONREACTIVE

## 2019-09-11 LAB — PROCALCITONIN: Procalcitonin: <0.1 ng/mL

## 2019-09-11 LAB — RESPIRATORY PANEL BY RT PCR (FLU A&B, COVID)
Influenza A by PCR: NEGATIVE
Influenza B by PCR: NEGATIVE
SARS Coronavirus 2 by RT PCR: NEGATIVE

## 2019-09-11 LAB — TROPONIN I (HIGH SENSITIVITY): Troponin I (High Sensitivity): 6 ng/L (ref <18)

## 2019-09-11 MED ORDER — ENOXAPARIN SODIUM 40 MG/0.4ML ~~LOC~~ SOLN
40.0000 mg | SUBCUTANEOUS | Status: DC
  Administered 2019-09-11 – 2019-09-12 (×2): 40 mg via SUBCUTANEOUS
  Filled 2019-09-11 (×2): qty 0.4

## 2019-09-11 MED ORDER — IPRATROPIUM-ALBUTEROL 0.5-2.5 (3) MG/3ML IN SOLN: 3.0000 mL | Freq: Four times a day (QID) | RESPIRATORY_TRACT | Status: DC | PRN

## 2019-09-11 MED ORDER — BUDESONIDE 0.25 MG/2ML IN SUSP
0.2500 mg | Freq: Two times a day (BID) | RESPIRATORY_TRACT | Status: DC
  Administered 2019-09-11 – 2019-09-13 (×5): 0.25 mg via RESPIRATORY_TRACT
  Filled 2019-09-11 (×5): qty 2

## 2019-09-11 MED ORDER — DEXAMETHASONE SODIUM PHOSPHATE 10 MG/ML IJ SOLN
6.0000 mg | Freq: Once | INTRAMUSCULAR | Status: AC
  Administered 2019-09-11: 6 mg via INTRAVENOUS
  Filled 2019-09-11: qty 1

## 2019-09-11 MED ORDER — GUAIFENESIN-DM 100-10 MG/5ML PO SYRP
5.0000 mL | ORAL_SOLUTION | ORAL | Status: DC | PRN
  Administered 2019-09-11 – 2019-09-13 (×3): 5 mL via ORAL
  Filled 2019-09-11 (×3): qty 5

## 2019-09-11 MED ORDER — LORATADINE 10 MG PO TABS
10.0000 mg | ORAL_TABLET | Freq: Every day | ORAL | Status: DC
  Administered 2019-09-11 – 2019-09-13 (×3): 10 mg via ORAL
  Filled 2019-09-11 (×3): qty 1

## 2019-09-11 MED ORDER — ACETAMINOPHEN 325 MG PO TABS: 650.0000 mg | ORAL_TABLET | Freq: Four times a day (QID) | ORAL | Status: DC | PRN

## 2019-09-11 MED ORDER — PRAVASTATIN SODIUM 40 MG PO TABS
40.0000 mg | ORAL_TABLET | Freq: Every day | ORAL | Status: DC
  Administered 2019-09-11 – 2019-09-12 (×2): 40 mg via ORAL
  Filled 2019-09-11 (×3): qty 1

## 2019-09-11 MED ORDER — ADULT MULTIVITAMIN W/MINERALS CH
1.0000 | ORAL_TABLET | Freq: Every day | ORAL | Status: DC
  Administered 2019-09-11 – 2019-09-13 (×3): 1 via ORAL
  Filled 2019-09-11 (×3): qty 1

## 2019-09-11 MED ORDER — METOPROLOL TARTRATE 25 MG PO TABS
25.0000 mg | ORAL_TABLET | Freq: Two times a day (BID) | ORAL | Status: DC
  Administered 2019-09-11 – 2019-09-12 (×3): 25 mg via ORAL
  Filled 2019-09-11 (×5): qty 1

## 2019-09-11 MED ORDER — SODIUM CHLORIDE 0.9% FLUSH
3.0000 mL | Freq: Two times a day (BID) | INTRAVENOUS | Status: DC
  Administered 2019-09-11 – 2019-09-13 (×4): 3 mL via INTRAVENOUS

## 2019-09-11 MED ORDER — CLONIDINE HCL 0.1 MG PO TABS
0.3000 mg | ORAL_TABLET | Freq: Every day | ORAL | Status: DC
  Administered 2019-09-11 – 2019-09-13 (×3): 0.3 mg via ORAL
  Filled 2019-09-11 (×3): qty 3

## 2019-09-11 MED ORDER — ACETAMINOPHEN 650 MG RE SUPP: 650.0000 mg | Freq: Four times a day (QID) | RECTAL | Status: DC | PRN

## 2019-09-11 MED ORDER — FUROSEMIDE 10 MG/ML IJ SOLN
40.0000 mg | Freq: Two times a day (BID) | INTRAMUSCULAR | Status: DC
  Administered 2019-09-11 – 2019-09-12 (×3): 40 mg via INTRAVENOUS
  Filled 2019-09-11 (×3): qty 4

## 2019-09-11 MED ORDER — IOHEXOL 350 MG/ML SOLN
75.0000 mL | Freq: Once | INTRAVENOUS | Status: AC | PRN
  Administered 2019-09-11: 75 mL via INTRAVENOUS

## 2019-09-11 MED ORDER — SODIUM CHLORIDE 0.9% FLUSH: 3.0000 mL | INTRAVENOUS | Status: DC | PRN

## 2019-09-11 MED ORDER — AMLODIPINE BESYLATE 10 MG PO TABS
10.0000 mg | ORAL_TABLET | Freq: Every day | ORAL | Status: DC
  Administered 2019-09-11 – 2019-09-13 (×3): 10 mg via ORAL
  Filled 2019-09-11 (×2): qty 1
  Filled 2019-09-11: qty 2

## 2019-09-11 MED ORDER — LISINOPRIL 10 MG PO TABS
10.0000 mg | ORAL_TABLET | Freq: Every day | ORAL | Status: DC
  Administered 2019-09-11 – 2019-09-13 (×3): 10 mg via ORAL
  Filled 2019-09-11 (×3): qty 1

## 2019-09-11 MED ORDER — POLYETHYLENE GLYCOL 3350 17 G PO PACK: 17.0000 g | PACK | Freq: Every day | ORAL | Status: DC | PRN

## 2019-09-11 MED ORDER — POTASSIUM CHLORIDE CRYS ER 20 MEQ PO TBCR
40.0000 meq | EXTENDED_RELEASE_TABLET | Freq: Every day | ORAL | Status: DC
  Administered 2019-09-11 – 2019-09-13 (×3): 40 meq via ORAL
  Filled 2019-09-11 (×3): qty 2

## 2019-09-11 MED ORDER — AMIODARONE HCL 200 MG PO TABS
200.0000 mg | ORAL_TABLET | Freq: Every day | ORAL | Status: DC
  Administered 2019-09-11 – 2019-09-13 (×3): 200 mg via ORAL
  Filled 2019-09-11 (×3): qty 1

## 2019-09-11 MED ORDER — SODIUM CHLORIDE 0.9 % IV SOLN: 250.0000 mL | INTRAVENOUS | Status: DC | PRN

## 2019-09-11 MED ORDER — ASPIRIN EC 81 MG PO TBEC
81.0000 mg | DELAYED_RELEASE_TABLET | Freq: Every day | ORAL | Status: DC
  Administered 2019-09-11 – 2019-09-13 (×3): 81 mg via ORAL
  Filled 2019-09-11 (×3): qty 1

## 2019-09-11 NOTE — ED Notes (Signed)
Patient found again at end of the stretcher without O2 on. Patient educated on need to keep O2 on and to use the call bell. Patient indicated understanding by making appropriate comments.

## 2019-09-11 NOTE — ED Notes (Signed)
Pt states that she has not been able to sleep well in three days due to the fact that when she falls asleep she "jerks awake, maybe like anxiety." Pt was noted to drift asleep during blood stick and woke up due to coughing with deeper inspirations. MD notified.

## 2019-09-11 NOTE — ED Provider Notes (Signed)
[facility] Emergency Department Provider Note  ____________________________________________  Time seen: Approximately 5:20 AM  I have reviewed the triage vital signs and the nursing notes.   HISTORY  Chief Complaint Shortness of Breath   HPI Beth Odonnell is a 69 y.o. female the history of hypertension and hyperlipidemia who presents for evaluation of shortness of breath.  Patient reports progressively worsening shortness of breath and bilateral lower extremity edema over the last month.  Over the last week symptoms have gotten worse.  She endorses orthopnea and paroxysmal nocturnal dyspnea, shortness of breath is present constantly but worse with ambulation and with laying flat.  She has noted significant swelling of both her lower extremities.   She has been sleeping sitting up on her bed for the last 2 to 3 weeks.  She denies any history of congestive heart failure.  She denies any chest pain.  She denies any personal or family history of blood clots, recent travel immobilization, leg pain, or exogenous hormones.  She denies fever or chills.  Past Medical History:  Diagnosis Date  . Hypercholesteremia   . Hypertension   . Irregular heart beat     Patient Active Problem List   Diagnosis Date Noted  . Nonsustained ventricular tachycardia ([place]) 07/27/2016  . Chest pain 07/27/2016  . Hypokalemia 07/27/2016  . Near syncope 07/27/2016  . NSVT (nonsustained ventricular tachycardia) ([place]) 07/27/2016    Past Surgical History:  Procedure Laterality Date  . CARDIAC CATHETERIZATION N/A 07/28/2016   Procedure: Left Heart Cath and Coronary Angiography and possible PCI and stent;  Surgeon: Yolonda Kida, MD;  Location: [facility];  Service: Cardiovascular;  Laterality: N/A;  . COLONOSCOPY WITH PROPOFOL N/A 05/28/2018   Procedure: COLONOSCOPY WITH PROPOFOL;  Surgeon: Jonathon Bellows, MD;  Location: [facility] ENDOSCOPY;  Service: Gastroenterology;   Laterality: N/A;    Prior to Admission medications   Medication Sig Start Date End Date Taking? Authorizing Provider  amiodarone (PACERONE) 200 MG tablet Take 1 tablet (200 mg total) by mouth daily. 07/30/16   Vaughan Basta, MD  amLODipine (NORVASC) 10 MG tablet Take 10 mg by mouth daily. 05/06/16   [provider]  aspirin EC 81 MG tablet Take 81 mg by mouth daily.    [provider]  cetirizine (ZYRTEC) 10 MG tablet Take 10 mg by mouth daily.    [provider]  cloNIDine (CATAPRES) 0.3 MG tablet Take 0.3 mg by mouth.    [provider]  FLUoxetine (PROZAC) 20 MG capsule Take 20 mg by mouth daily.    [provider]  lisinopril-hydrochlorothiazide (PRINZIDE,ZESTORETIC) 10-12.5 MG tablet Take by mouth.    [provider]  metoprolol tartrate (LOPRESSOR) 25 MG tablet Take 1 tablet (25 mg total) by mouth 2 (two) times daily. 07/29/16   Vaughan Basta, MD  Multiple Vitamin (MULTI-VITAMINS) TABS Take by mouth.    [provider]  pravastatin (PRAVACHOL) 40 MG tablet Take 40 mg by mouth daily.    [provider]    Allergies Penicillins and Cat hair extract  No family history on file.  Social History Social History   Tobacco Use  . Smoking status: Current Every Day Smoker  . Smokeless tobacco: Never Used  Substance Use Topics  . Alcohol use: No  . Drug use: Not on file    Review of Systems  Constitutional: Negative for fever. Eyes: Negative for visual changes. ENT: Negative for sore throat. Neck: No neck pain  Cardiovascular: Negative  for chest pain. + orthopnea, PND Respiratory: + shortness of breath. Gastrointestinal: Negative for abdominal pain, vomiting or diarrhea. Genitourinary: Negative for dysuria. Musculoskeletal: Negative for back pain. + b/l leg swelling Skin: Negative for rash. Neurological: Negative for headaches, weakness or numbness. Psych: No SI or  HI  ____________________________________________   PHYSICAL EXAM:  VITAL SIGNS: ED Triage Vitals  Enc Vitals Group     BP 09/11/19 0458 (!) 183/90     Pulse Rate 09/11/19 0453 98     Resp 09/11/19 0453 (!) 24     Temp 09/11/19 0458 98.2 F (36.8 C)     Temp Source 09/11/19 0458 Oral     SpO2 09/11/19 0453 (!) 89 %     Weight 09/11/19 0451 234 lb (106.1 kg)     Height 09/11/19 0451 5\' 11"  (1.803 m)     Head Circumference --      Peak Flow --      Pain Score --      Pain Loc --      Pain Edu? --      Excl. in [place]? --     Constitutional: Alert and oriented. Well appearing and in no apparent distress. HEENT:      Head: Normocephalic and atraumatic.         Eyes: Conjunctivae are normal. Sclera is non-icteric.       Mouth/Throat: Mucous membranes are moist.       Neck: Supple with no signs of meningismus. Cardiovascular: Regular rate and rhythm, no murmurs.  Elevated JVD to the anlge of the jaw Respiratory: Increased work of breathing, hypoxic to 89% on room air, tachypneic, diffuse bilateral expiratory wheezes  gastrointestinal: Soft, non tender, and non distended with positive bowel sounds. No rebound or guarding. Musculoskeletal: Nontender with normal range of motion in all extremities. No edema, cyanosis, or erythema of extremities. Neurologic: 2+ pitting edema bilaterally. Skin: Skin is warm, dry and intact. No rash noted. Psychiatric: Mood and affect are normal. Speech and behavior are normal.  ____________________________________________   LABS (all labs ordered are listed, but only abnormal results are displayed)  Labs Reviewed  CBC WITH DIFFERENTIAL/PLATELET - Abnormal; Notable for the following components:      Result Value   WBC 12.5 (*)    Neutro Abs 8.2 (*)    Eosinophils Absolute 0.6 (*)    All other components within normal limits  COMPREHENSIVE METABOLIC PANEL - Abnormal; Notable for the following components:   Potassium 3.4 (*)    Glucose, Bld 169 (*)     All other components within normal limits  RESPIRATORY PANEL BY RT PCR (FLU A&B, COVID)  BRAIN NATRIURETIC PEPTIDE  PROCALCITONIN  TROPONIN I (HIGH SENSITIVITY)   ____________________________________________  EKG  ED ECG REPORT I, Rudene Re, the attending physician, personally viewed and interpreted this ECG.  Normal sinus rhythm, rate of 95, normal intervals, normal axis, no ST elevations or depressions, anterior Q waves. Q waves seen on EKG from 2018 ____________________________________________  RADIOLOGY  I have personally reviewed the images performed during this visit and I agree with the Radiologist's read.   Interpretation by Radiologist:  CT Angio Chest PE W and/or Wo Contrast  Result Date: 09/11/2019 CLINICAL DATA:  69 year old female with history of hypoxemia. Shortness of breath for 1 week, progressively worsening. EXAM: CT ANGIOGRAPHY CHEST WITH CONTRAST TECHNIQUE: Multidetector CT imaging of the chest was performed using the standard protocol during bolus administration of intravenous contrast. Multiplanar CT image reconstructions and MIPs were  obtained to evaluate the vascular anatomy. CONTRAST:  2mL OMNIPAQUE IOHEXOL 350 MG/ML SOLN COMPARISON:  Chest CT 03/02/2008. FINDINGS: Cardiovascular: No filling defects within the pulmonary arterial tree to suggest underlying pulmonary embolism. Heart size is mildly enlarged. There is no significant pericardial fluid, thickening or pericardial calcification. There is aortic atherosclerosis, as well as atherosclerosis of the great vessels of the mediastinum and the coronary arteries, including calcified atherosclerotic plaque in the left main, left anterior descending and right coronary arteries. Mediastinum/Nodes: No pathologically enlarged mediastinal or hilar lymph nodes. Esophagus is unremarkable in appearance. No axillary lymphadenopathy. Lungs/Pleura: Patchy multifocal areas of ground-glass attenuation are noted in the  lungs bilaterally, most evident throughout the upper lungs. No pleural effusions. No definite suspicious appearing pulmonary nodules or masses are noted. Upper Abdomen: Unremarkable. Musculoskeletal: There are no aggressive appearing lytic or blastic lesions noted in the visualized portions of the skeleton. Review of the MIP images confirms the above findings. IMPRESSION: 1. No evidence of pulmonary embolism. 2. Patchy multifocal areas of ground-glass attenuation noted throughout the upper lungs bilaterally. Findings are concerning for multilobar atypical pneumonia. The upper lung predominance of this finding would be unusual for COVID-19 infection, however, this remains in the differential diagnosis. Clinical correlation for signs and symptoms of atypical pneumonia is recommended. 3. Aortic atherosclerosis, in addition to left main and 2 vessel coronary artery disease. Please note that although the presence of coronary artery calcium documents the presence of coronary artery disease, the severity of this disease and any potential stenosis cannot be assessed on this non-gated CT examination. Assessment for potential risk factor modification, dietary therapy or pharmacologic therapy may be warranted, if clinically indicated. Aortic Atherosclerosis (ICD10-I70.0). Electronically Signed   By: Vinnie Langton M.D.   On: 09/11/2019 07:17   DG Chest Portable 1 View  Result Date: 09/11/2019 CLINICAL DATA:  69 year old female with history of shortness of breath for 1 week. Tested positive for COVID-19 last week. EXAM: PORTABLE CHEST 1 VIEW COMPARISON:  Chest x-ray 09/05/2019. FINDINGS: Lung volumes are normal. Scattered areas with peribronchial cuffing and mild interstitial prominence, similar to the prior study. No consolidative airspace disease. No pleural effusions. No pneumothorax. No pulmonary nodule or mass noted. Pulmonary vasculature and the cardiomediastinal silhouette are within normal limits. Atherosclerosis  in the thoracic aorta. IMPRESSION: 1. No radiographic evidence of acute cardiopulmonary disease. 2. Mild peribronchial cuffing and interstitial prominence similar to prior examinations, potentially related to chronic bronchitis. 3. Aortic atherosclerosis. Electronically Signed   By: Vinnie Langton M.D.   On: 09/11/2019 05:30     ____________________________________________   PROCEDURES  Procedure(s) performed: None Procedures Critical Care performed: yes  CRITICAL CARE Performed by: Rudene Re  ?  Total critical care time: 35 min  Critical care time was exclusive of separately billable procedures and treating other patients.  Critical care was necessary to treat or prevent imminent or life-threatening deterioration.  Critical care was time spent personally by me on the following activities: development of treatment plan with patient and/or surrogate as well as nursing, discussions with consultants, evaluation of patient's response to treatment, examination of patient, obtaining history from patient or surrogate, ordering and performing treatments and interventions, ordering and review of laboratory studies, ordering and review of radiographic studies, pulse oximetry and re-evaluation of patient's condition.  ____________________________________________   INITIAL IMPRESSION / ASSESSMENT AND PLAN / ED COURSE   69 y.o. female the history of hypertension and hyperlipidemia who presents for evaluation of shortness of breath, orthopnea, PND, bilateral leg  swelling progressively over a month.  Patient is in mild respiratory distress, hypoxic to 89% with bilateral wheezing and tachypneic.  She looks volume overloaded with elevated JVD and 2+ pitting edema.  Differential diagnosis including CHF versus PE versus pericardial effusion.  Plan for EKG, labs, CXR. Patient placed on 2L [place] with resolution of hypoxia. Anticipate admission   As part of my medical decision making, I  reviewed the following data within the [place] CXR with no evidence of edema; CTA showing possible atypical PNA, no edema or signs of volume overload or PE. I have  reviewed the EKG and looked at the rhythm strip in the room which showed NSR. I have reviewed patient's previous medical records and PMH. Labs were reviewed by me and showed normal BNP and troponin; mild leukocytosis. COVID pending. Patient remains on 2L [place]. Decadron ordered. Plan to start remdesivir if COVID positive. Procalcitonin pending. Plan to start abx if elevated. Discussed with Hospitalist for admission. Care transferred to Dr. Jari Pigg.       _____________________________________________ Please note:  Patient was evaluated in Emergency Department today for the symptoms described in the history of present illness. Patient was evaluated in the context of the global COVID-19 pandemic, which necessitated consideration that the patient might be at risk for infection with the SARS-CoV-2 virus that causes COVID-19. Institutional protocols and algorithms that pertain to the evaluation of patients at risk for COVID-19 are in a state of rapid change based on information released by regulatory bodies including the CDC and federal and state organizations. These policies and algorithms were followed during the patient's care in the ED.  Some ED evaluations and interventions may be delayed as a result of limited staffing during the pandemic.   ____________________________________________   FINAL CLINICAL IMPRESSION(S) / ED DIAGNOSES   Final diagnoses:  Acute respiratory failure with hypoxia (HCC)  Hypervolemia, unspecified hypervolemia type  Atypical pneumonia      NEW MEDICATIONS STARTED DURING THIS VISIT:  ED Discharge Orders    None       Note:  This document was prepared using Dragon voice recognition software and may include unintentional dictation errors.    Alfred Levins, [place], MD 09/11/19 [phone]

## 2019-09-11 NOTE — H&P (Signed)
History and Physical    Beth Odonnell [id number] DOB: February 11, 1951 DOA: 09/11/2019  PCP: [facility] [place]   Patient coming from: Home  I have personally briefly reviewed patient's old medical records in [place]  Chief Complaint: Shortness of breath  HPI: Beth Odonnell is a 69 y.o. female with medical history significant for coronary artery disease, history of nonsustained ventricular tachycardia, hypertension who presented for evaluation of shortness of breath and bilateral lower extremity swelling for over 1 month but worse in the last 1 week.  Shortness of breath occurred with both exertion and is associated with two-pillow orthopnea and paroxysmal nocturnal dyspnea.  She denies having any chest pain, cough, fever, nausea, vomiting, diaphoresis or palpitations. She denies feeling dizzy or light headed. She denies abdominal pain, changes in bowel habits, urinary frequency, nocturia or dysuria.  Patient had a negative Covid test but a week ago.  Her pulse oximetry on room air at rest was 89% and improved following oxygen supplementation at 2 L.  She had significantly elevated blood pressure in the emergency room and admitted to occasional noncompliance with her medications and diet.  ED Course:  69 y.o. female with history of hypertension and hyperlipidemia who presents for evaluation of shortness of breath, orthopnea, PND, bilateral leg swelling progressively over a month.  Patient noted to be in mild respiratory distress, hypoxic to 89% with bilateral wheezing and tachypneic.  She appears volume overloaded with elevated JVD and 2+ pitting edema.  CT angiogram of the chest shows patchy multifocal areas of groundglass attenuation noted throughout the upper lungs bilaterally.  Findings are concerning for multilobar atypical pneumonia.  Patient received a dose of Decadron the emergency room. COVID test was negative Review of Systems: As per HPI otherwise 10 point  review of systems negative.    Past Medical History:  Diagnosis Date   Hypercholesteremia    Hypertension    Irregular heart beat     Past Surgical History:  Procedure Laterality Date   CARDIAC CATHETERIZATION N/A 07/28/2016   Procedure: Left Heart Cath and Coronary Angiography and possible PCI and stent;  Surgeon: Yolonda Kida, MD;  Location: [facility];  Service: Cardiovascular;  Laterality: N/A;   COLONOSCOPY WITH PROPOFOL N/A 05/28/2018   Procedure: COLONOSCOPY WITH PROPOFOL;  Surgeon: Jonathon Bellows, MD;  Location: [facility];  Service: Gastroenterology;  Laterality: N/A;     reports that she has been smoking. She has never used smokeless tobacco. She reports that she does not drink alcohol. No history on file for drug.  Allergies  Allergen Reactions   Penicillins Other (See Comments)    Patient passed out.  Has patient had a PCN reaction causing immediate rash, facial/tongue/throat swelling, SOB or lightheadedness with hypotension: No Has patient had a PCN reaction causing severe rash involving mucus membranes or skin necrosis: No Has patient had a PCN reaction that required hospitalization No Has patient had a PCN reaction occurring within the last 10 years: No If all of the above answers are "NO", then may proceed with Cephalosporin use. Went to hospital   Cat Hair Extract     hives    No family history on file.   Prior to Admission medications   Medication Sig Start Date End Date Taking? Authorizing Provider  amiodarone (PACERONE) 200 MG tablet Take 1 tablet (200 mg total) by mouth daily. 07/30/16   Vaughan Basta, MD  amLODipine (NORVASC) 10 MG tablet Take 10 mg by mouth daily. 05/06/16  [provider]  aspirin EC 81 MG tablet Take 81 mg by mouth daily.    [provider]  cetirizine (ZYRTEC) 10 MG tablet Take 10 mg by mouth daily.    [provider]  cloNIDine (CATAPRES) 0.3 MG tablet Take 0.3 mg by  mouth.    [provider]  FLUoxetine (PROZAC) 20 MG capsule Take 20 mg by mouth daily.    [provider]  lisinopril-hydrochlorothiazide (PRINZIDE,ZESTORETIC) 10-12.5 MG tablet Take by mouth.    [provider]  metoprolol tartrate (LOPRESSOR) 25 MG tablet Take 1 tablet (25 mg total) by mouth 2 (two) times daily. 07/29/16   Vaughan Basta, MD  Multiple Vitamin (MULTI-VITAMINS) TABS Take by mouth.    [provider]  pravastatin (PRAVACHOL) 40 MG tablet Take 40 mg by mouth daily.    [provider]    Physical Exam: Vitals:   09/11/19 0458 09/11/19 0719 09/11/19 0730 09/11/19 0809  BP: (!) 183/90  (!) 146/100 (!) 160/87  Pulse:   97 99  Resp:    20  Temp: 98.2 F (36.8 C)     TempSrc: Oral     SpO2:  (!) 87% 94% 93%  Weight:      Height:         Vitals:   09/11/19 0458 09/11/19 0719 09/11/19 0730 09/11/19 0809  BP: (!) 183/90  (!) 146/100 (!) 160/87  Pulse:   97 99  Resp:    20  Temp: 98.2 F (36.8 C)     TempSrc: Oral     SpO2:  (!) 87% 94% 93%  Weight:      Height:        Constitutional: NAD, alert and oriented to person place and time.  Appears uncomfortable and restless Eyes: PERRL, lids and conjunctivae normal ENMT: Mucous membranes are moist.  Neck: normal, supple, no masses, no thyromegaly Respiratory: Bilateral air entry, scattered wheezing, faint crackles. Normal respiratory effort. No accessory muscle use.  Cardiovascular: Regular rate and rhythm, no murmurs / rubs / gallops.  Trace extremity edema. 2+ pedal pulses. No carotid bruits.  Abdomen: no tenderness, no masses palpated. No hepatosplenomegaly. Bowel sounds positive.  Musculoskeletal: no clubbing / cyanosis. No joint deformity upper and lower extremities.  Skin: no rashes, lesions, ulcers.  Neurologic: No gross focal neurologic deficit. Psychiatric: Normal mood and affect.   Labs on Admission: I have personally reviewed following labs and imaging  studies  CBC: Recent Labs  Lab 09/11/19 0458  WBC 12.5*  NEUTROABS 8.2*  HGB 14.4  HCT 43.6  MCV 85.7  PLT [id number]   Basic Metabolic Panel: Recent Labs  Lab 09/11/19 0544  NA 141  K 3.4*  CL 100  CO2 29  GLUCOSE 169*  BUN 15  CREATININE 0.57  CALCIUM 9.1   GFR: Estimated Creatinine Clearance: 91.5 mL/min (by C-G formula based on SCr of 0.57 mg/dL). Liver Function Tests: Recent Labs  Lab 09/11/19 0544  AST 24  ALT 30  ALKPHOS 76  BILITOT 0.5  PROT 8.0  ALBUMIN 4.0   No results for input(s): LIPASE, AMYLASE in the last 168 hours. No results for input(s): AMMONIA in the last 168 hours. Coagulation Profile: No results for input(s): INR, PROTIME in the last 168 hours. Cardiac Enzymes: No results for input(s): CKTOTAL, CKMB, CKMBINDEX, TROPONINI in the last 168 hours. BNP (last 3 results) No results for input(s): PROBNP in the last 8760 hours. HbA1C: No results for input(s): HGBA1C in the last 72  hours. CBG: No results for input(s): GLUCAP in the last 168 hours. Lipid Profile: No results for input(s): CHOL, HDL, LDLCALC, TRIG, CHOLHDL, LDLDIRECT in the last 72 hours. Thyroid Function Tests: No results for input(s): TSH, T4TOTAL, FREET4, T3FREE, THYROIDAB in the last 72 hours. Anemia Panel: No results for input(s): VITAMINB12, FOLATE, FERRITIN, TIBC, IRON, RETICCTPCT in the last 72 hours. Urine analysis:    Component Value Date/Time   COLORURINE YELLOW (A) 07/29/2016 1335   APPEARANCEUR CLEAR (A) 07/29/2016 1335   LABSPEC 1.018 07/29/2016 1335   PHURINE 6.0 07/29/2016 1335   GLUCOSEU NEGATIVE 07/29/2016 1335   HGBUR LARGE (A) 07/29/2016 1335   BILIRUBINUR NEGATIVE 07/29/2016 1335   KETONESUR NEGATIVE 07/29/2016 1335   PROTEINUR NEGATIVE 07/29/2016 1335   NITRITE NEGATIVE 07/29/2016 1335   LEUKOCYTESUR NEGATIVE 07/29/2016 1335    Radiological Exams on Admission: CT Angio Chest PE W and/or Wo Contrast  Result Date: 09/11/2019 CLINICAL DATA:  69 year old  female with history of hypoxemia. Shortness of breath for 1 week, progressively worsening. EXAM: CT ANGIOGRAPHY CHEST WITH CONTRAST TECHNIQUE: Multidetector CT imaging of the chest was performed using the standard protocol during bolus administration of intravenous contrast. Multiplanar CT image reconstructions and MIPs were obtained to evaluate the vascular anatomy. CONTRAST:  38mL OMNIPAQUE IOHEXOL 350 MG/ML SOLN COMPARISON:  Chest CT 03/02/2008. FINDINGS: Cardiovascular: No filling defects within the pulmonary arterial tree to suggest underlying pulmonary embolism. Heart size is mildly enlarged. There is no significant pericardial fluid, thickening or pericardial calcification. There is aortic atherosclerosis, as well as atherosclerosis of the great vessels of the mediastinum and the coronary arteries, including calcified atherosclerotic plaque in the left main, left anterior descending and right coronary arteries. Mediastinum/Nodes: No pathologically enlarged mediastinal or hilar lymph nodes. Esophagus is unremarkable in appearance. No axillary lymphadenopathy. Lungs/Pleura: Patchy multifocal areas of ground-glass attenuation are noted in the lungs bilaterally, most evident throughout the upper lungs. No pleural effusions. No definite suspicious appearing pulmonary nodules or masses are noted. Upper Abdomen: Unremarkable. Musculoskeletal: There are no aggressive appearing lytic or blastic lesions noted in the visualized portions of the skeleton. Review of the MIP images confirms the above findings. IMPRESSION: 1. No evidence of pulmonary embolism. 2. Patchy multifocal areas of ground-glass attenuation noted throughout the upper lungs bilaterally. Findings are concerning for multilobar atypical pneumonia. The upper lung predominance of this finding would be unusual for COVID-19 infection, however, this remains in the differential diagnosis. Clinical correlation for signs and symptoms of atypical pneumonia is  recommended. 3. Aortic atherosclerosis, in addition to left main and 2 vessel coronary artery disease. Please note that although the presence of coronary artery calcium documents the presence of coronary artery disease, the severity of this disease and any potential stenosis cannot be assessed on this non-gated CT examination. Assessment for potential risk factor modification, dietary therapy or pharmacologic therapy may be warranted, if clinically indicated. Aortic Atherosclerosis (ICD10-I70.0). Electronically Signed   By: Vinnie Langton M.D.   On: 09/11/2019 07:17   DG Chest Portable 1 View  Result Date: 09/11/2019 CLINICAL DATA:  69 year old female with history of shortness of breath for 1 week. Tested positive for COVID-19 last week. EXAM: PORTABLE CHEST 1 VIEW COMPARISON:  Chest x-ray 09/05/2019. FINDINGS: Lung volumes are normal. Scattered areas with peribronchial cuffing and mild interstitial prominence, similar to the prior study. No consolidative airspace disease. No pleural effusions. No pneumothorax. No pulmonary nodule or mass noted. Pulmonary vasculature and the cardiomediastinal silhouette are within normal limits. Atherosclerosis in the  thoracic aorta. IMPRESSION: 1. No radiographic evidence of acute cardiopulmonary disease. 2. Mild peribronchial cuffing and interstitial prominence similar to prior examinations, potentially related to chronic bronchitis. 3. Aortic atherosclerosis. Electronically Signed   By: Vinnie Langton M.D.   On: 09/11/2019 05:30    EKG: Independently reviewed.  Sinus rhythm  Assessment/Plan Principal Problem:   Acute on chronic respiratory failure with hypoxia (HCC) Active Problems:   Hypokalemia   NSVT (nonsustained ventricular tachycardia) (HCC)   Essential hypertension   Acute CHF (congestive heart failure) (HCC)   Acute respiratory failure with hypoxia (HCC)     Acute respiratory failure Unclear etiology and may be secondary to acute CHF CT  angiogram of the chest is abnormal and shows patchy multifocal areas of groundglass attenuation in the upper lobes concerning for multilobar pneumonia.   Patient has white count of 12,000 but is afebrile.  Procalcitonin level is within normal limits.  No indication for antibiotic therapy at this time She had a room air pulse oximetry between 87 - 89% that improved following oxygen supplementation at 2 L Obtain 2D echocardiogram to assess LVEF    Acute congestive heart failure Likely diastolic dysfunction.   Patient had a left heart cath in 2018 which showed preserved LVEF She had significant elevated blood pressures on admission Optimize blood pressure control Follow up results of 2D echocardiogram Start patient on furosemide 40 mg IV every 12 Continue lisinopril and metoprolol Consult cardiology   Hypokalemia Secondary to diuretic therapy Supplement potassium   Hypertensive urgency Optimize blood pressure control   History of nonsustained ventricular tachycardia Continue amiodarone   DVT prophylaxis: Lovenox Code Status: Full code Family Communication: Plan of care was discussed with patient. She verbalizes understanding and agrees with the plan Disposition Plan: Back to previous home environment Consults called: Cardiology    Rainah Kirshner MD [facility]     09/11/2019, 8:13 AM

## 2019-09-11 NOTE — ED Notes (Signed)
Heads up given, per Colletta Maryland RN pt okay to go to inpatient room. Transportation requested.

## 2019-09-11 NOTE — ED Notes (Signed)
Per lab light green blood sample hemolyzed. Redraw collected at this time and sent down for re-analysis

## 2019-09-11 NOTE — ED Notes (Signed)
Patient placed on 2L [place]. Oxygen saturation level increased to 95%.

## 2019-09-11 NOTE — ED Notes (Signed)
Pt given meal tray.

## 2019-09-11 NOTE — ED Notes (Signed)
Pt states she would like to be discharged today. Education regarding need for oxygen and admission status given. Pt verbalizes understanding. Pt continuously removes nasal canula- tape applied to secure [place].

## 2019-09-11 NOTE — ED Notes (Signed)
Blood work sent to lab at this time.  

## 2019-09-11 NOTE — ED Notes (Signed)
Report given to [place] RN

## 2019-09-11 NOTE — Consult Note (Signed)
Cardiology Consultation Note    Patient ID: Beth Odonnell, MRN: [id number], DOB/AGE: 07/17/1950 69 y.o. Admit date: 09/11/2019   Date of Consult: 09/11/2019 Primary Physician: [facility] [place] Primary Cardiologist: Dr. Clayborn Bigness  Chief Complaint: sob/anxiety Reason for Consultation: [name] Requesting MD: Dr. Francine Graven  HPI: Beth Odonnell is a 69 y.o. 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  She presented to the emergency room with complaints of feeling like she cannot breathe.  She had a cardiac catheterization in 2018 revealing mild to moderate nonobstructive disease with a normal ejection fraction. Echocardiogram at that time revealed normal LV function.  There was no significant valvular disease.  She complained of shortness of breath.  In the emergency room chest x-ray revealed no acute cardiopulmonary disease.  There was no evidence of pulmonary edema.  Chest CT revealed no evidence of pulmonary embolus.  There was no evidence of pulmonary edema.  BNP was normal at 34.  High-sensitivity troponin was normal at 6.  EKG showed normal sinus rhythm with no ischemia.  Echocardiogram is pending.  Patient improving with bronchodilators. Past Medical History:  Diagnosis Date  . Hypercholesteremia   . Hypertension   . Irregular heart beat       Surgical History:  Past Surgical History:  Procedure Laterality Date  . CARDIAC CATHETERIZATION N/A 07/28/2016   Procedure: Left Heart Cath and Coronary Angiography and possible PCI and stent;  Surgeon: Yolonda Kida, MD;  Location: [facility];  Service: Cardiovascular;  Laterality: N/A;  . COLONOSCOPY WITH PROPOFOL N/A 05/28/2018   Procedure: COLONOSCOPY WITH PROPOFOL;  Surgeon: Jonathon Bellows, MD;  Location: [facility];  Service: Gastroenterology;  Laterality: N/A;     Home Meds: Prior to Admission medications    Medication Sig Start Date End Date Taking? Authorizing Provider  albuterol (VENTOLIN HFA) 108 (90 Base) MCG/ACT inhaler Inhale 1-2 puffs into the lungs every 4 (four) hours as needed for wheezing or shortness of breath. 09/06/19  Yes [provider]  amiodarone (PACERONE) 200 MG tablet Take 1 tablet (200 mg total) by mouth daily. 07/30/16  Yes Vaughan Basta, MD  amLODipine (NORVASC) 10 MG tablet Take 10 mg by mouth daily. 05/06/16  Yes [provider]  atorvastatin (LIPITOR) 20 MG tablet Take 20 mg by mouth daily. 08/22/19  Yes [provider]  EPINEPHrine (PRIMATENE MIST) 0.125 MG/ACT AERO Inhale 1-2 puffs into the lungs 4 (four) times daily as needed (asthma symptoms).   Yes [provider]  furosemide (LASIX) 20 MG tablet Take 20 mg by mouth daily. 09/07/19 09/21/19 Yes [provider]  losartan (COZAAR) 25 MG tablet Take 25 mg by mouth daily. 08/22/19  Yes [provider]  meloxicam (MOBIC) 15 MG tablet Take 15 mg by mouth daily. 08/22/19  Yes [provider]  metoprolol tartrate (LOPRESSOR) 25 MG tablet Take 1 tablet (25 mg total) by mouth 2 (two) times daily. 07/29/16  Yes Vaughan Basta, MD    Inpatient Medications:  . amiodarone  200 mg Oral Daily  . amLODipine  10 mg Oral Daily  . aspirin EC  81 mg Oral Daily  . budesonide (PULMICORT) nebulizer solution  0.25 mg Nebulization BID  . cloNIDine  0.3 mg Oral Daily  . enoxaparin (LOVENOX) injection  40 mg Subcutaneous Q24H  . furosemide  40 mg Intravenous Q12H  . lisinopril  10 mg Oral Daily  . loratadine  10 mg Oral Daily  . metoprolol tartrate  25 mg Oral BID  . multivitamin with minerals  1 tablet Oral Daily  . potassium chloride  40 mEq Oral Daily  . pravastatin  40 mg Oral QHS  . sodium chloride flush  3 mL Intravenous Q12H   . sodium chloride      Allergies:  Allergies  Allergen Reactions  . Penicillins Other (See Comments)    Patient passed  out.  Has patient had a PCN reaction causing immediate rash, facial/tongue/throat swelling, SOB or lightheadedness with hypotension: No Has patient had a PCN reaction causing severe rash involving mucus membranes or skin necrosis: No Has patient had a PCN reaction that required hospitalization No Has patient had a PCN reaction occurring within the last 10 years: No If all of the above answers are "NO", then may proceed with Cephalosporin use. Went to hospital  . Cat Hair Extract     hives    Social History   Socioeconomic History  . Marital status: Single    Spouse name: Not on file  . Number of children: Not on file  . Years of education: Not on file  . Highest education level: Not on file  Occupational History  . Not on file  Tobacco Use  . Smoking status: Current Every Day Smoker  . Smokeless tobacco: Never Used  Substance and Sexual Activity  . Alcohol use: No  . Drug use: Not on file  . Sexual activity: Not on file  Other Topics Concern  . Not on file  Social History Narrative  . Not on file   Social Determinants of Health   Financial Resource Strain:   . Difficulty of Paying Living Expenses: Not on file  Food Insecurity:   . Worried About Charity fundraiser in the Last Year: Not on file  . Ran Out of Food in the Last Year: Not on file  Transportation Needs:   . Lack of Transportation (Medical): Not on file  . Lack of Transportation (Non-Medical): Not on file  Physical Activity:   . Days of Exercise per Week: Not on file  . Minutes of Exercise per Session: Not on file  Stress:   . Feeling of Stress : Not on file  Social Connections:   . Frequency of Communication with Friends and Family: Not on file  . Frequency of Social Gatherings with Friends and Family: Not on file  . Attends Religious Services: Not on file  . Active Member of Clubs or Organizations: Not on file  . Attends Archivist Meetings: Not on file  . Marital Status: Not on file   Intimate Partner Violence:   . Fear of Current or Ex-Partner: Not on file  . Emotionally Abused: Not on file  . Physically Abused: Not on file  . Sexually Abused: Not on file     No family history on file.   Review of Systems: A 12-system review of systems was performed and is negative except as noted in the HPI.  Labs: No results for input(s): CKTOTAL, CKMB, TROPONINI in the last 72 hours. Lab Results  Component Value Date   WBC 12.5 (H) 09/11/2019   HGB 14.4 09/11/2019   HCT 43.6 09/11/2019   MCV 85.7 09/11/2019   PLT 300 09/11/2019    Recent Labs  Lab 09/11/19 0544  NA 141  K 3.4*  CL 100  CO2 29  BUN 15  CREATININE 0.57  CALCIUM 9.1  PROT 8.0  BILITOT 0.5  ALKPHOS 76  ALT 30  AST 24  GLUCOSE 169*   No results found for: CHOL, HDL, LDLCALC, TRIG No results found for: DDIMER  Radiology/Studies:  DG Chest 2 View  Result Date: 09/05/2019 CLINICAL DATA:  Shortness of breath, current smoker EXAM: CHEST - 2 VIEW COMPARISON:  Radiograph 07/27/2016, CT 03/02/2008 FINDINGS: Hyperinflation of lungs with flattening of the diaphragms and coarsened interstitial changes. No consolidation, features of edema, pneumothorax, or effusion. The cardiomediastinal contours are unremarkable. No acute osseous or soft tissue abnormality. Degenerative changes are present in the imaged spine and shoulders. IMPRESSION: Lung hyperinflation and coarsened interstitium, may reflect smoking related changes. No other acute cardiopulmonary abnormality. Electronically Signed   By: Lovena Le M.D.   On: 09/05/2019 20:04   CT Angio Chest PE W and/or Wo Contrast  Result Date: 09/11/2019 CLINICAL DATA:  69 year old female with history of hypoxemia. Shortness of breath for 1 week, progressively worsening. EXAM: CT ANGIOGRAPHY CHEST WITH CONTRAST TECHNIQUE: Multidetector CT imaging of the chest was performed using the standard protocol during bolus administration of intravenous contrast. Multiplanar CT  image reconstructions and MIPs were obtained to evaluate the vascular anatomy. CONTRAST:  26mL OMNIPAQUE IOHEXOL 350 MG/ML SOLN COMPARISON:  Chest CT 03/02/2008. FINDINGS: Cardiovascular: No filling defects within the pulmonary arterial tree to suggest underlying pulmonary embolism. Heart size is mildly enlarged. There is no significant pericardial fluid, thickening or pericardial calcification. There is aortic atherosclerosis, as well as atherosclerosis of the great vessels of the mediastinum and the coronary arteries, including calcified atherosclerotic plaque in the left main, left anterior descending and right coronary arteries. Mediastinum/Nodes: No pathologically enlarged mediastinal or hilar lymph nodes. Esophagus is unremarkable in appearance. No axillary lymphadenopathy. Lungs/Pleura: Patchy multifocal areas of ground-glass attenuation are noted in the lungs bilaterally, most evident throughout the upper lungs. No pleural effusions. No definite suspicious appearing pulmonary nodules or masses are noted. Upper Abdomen: Unremarkable. Musculoskeletal: There are no aggressive appearing lytic or blastic lesions noted in the visualized portions of the skeleton. Review of the MIP images confirms the above findings. IMPRESSION: 1. No evidence of pulmonary embolism. 2. Patchy multifocal areas of ground-glass attenuation noted throughout the upper lungs bilaterally. Findings are concerning for multilobar atypical pneumonia. The upper lung predominance of this finding would be unusual for COVID-19 infection, however, this remains in the differential diagnosis. Clinical correlation for signs and symptoms of atypical pneumonia is recommended. 3. Aortic atherosclerosis, in addition to left main and 2 vessel coronary artery disease. Please note that although the presence of coronary artery calcium documents the presence of coronary artery disease, the severity of this disease and any potential stenosis cannot be assessed  on this non-gated CT examination. Assessment for potential risk factor modification, dietary therapy or pharmacologic therapy may be warranted, if clinically indicated. Aortic Atherosclerosis (ICD10-I70.0). Electronically Signed   By: Vinnie Langton M.D.   On: 09/11/2019 07:17   DG Chest Portable 1 View  Result Date: 09/11/2019 CLINICAL DATA:  69 year old female with history of shortness of breath for 1 week. Tested positive for COVID-19 last week. EXAM: PORTABLE CHEST 1 VIEW COMPARISON:  Chest x-ray 09/05/2019. FINDINGS: Lung volumes are normal. Scattered areas with peribronchial cuffing and mild interstitial prominence, similar to the prior study. No consolidative airspace disease. No pleural effusions. No pneumothorax. No pulmonary nodule or mass noted. Pulmonary vasculature and the cardiomediastinal silhouette are within normal limits. Atherosclerosis in the thoracic aorta. IMPRESSION: 1. No radiographic evidence of acute cardiopulmonary disease. 2. Mild  peribronchial cuffing and interstitial prominence similar to prior examinations, potentially related to chronic bronchitis. 3. Aortic atherosclerosis. Electronically Signed   By: Vinnie Langton M.D.   On: 09/11/2019 05:30    Wt Readings from Last 3 Encounters:  09/11/19 106.1 kg  05/28/18 94 kg  07/29/16 94 kg    EKG: Sinus rhythm with nonspecific ST-T wave changes  Physical Exam: African-American female in some respiratory distress somewhat anxious. Blood pressure (!) 151/91, pulse 94, temperature 98.2 F (36.8 C), temperature source Oral, resp. rate 18, height 5\' 11"  (1.803 m), weight 106.1 kg, SpO2 93 %. Body mass index is 32.64 kg/m. General: Well developed, well nourished, in no acute distress. Head: Normocephalic, atraumatic, sclera non-icteric, no xanthomas, nares are without discharge.  Neck: Negative for carotid bruits. JVD not elevated. Lungs: Clear bilaterally to auscultation without wheezes, rales, or rhonchi. Breathing is  unlabored. Heart: RRR with S1 S2. No murmurs, rubs, or gallops appreciated. Abdomen: Soft, non-tender, non-distended with normoactive bowel sounds. No hepatomegaly. No rebound/guarding. No obvious abdominal masses. Msk:  Strength and tone appear normal for age. Extremities: No clubbing or cyanosis. No edema.  Distal pedal pulses are 2+ and equal bilaterally. Neuro: Alert and oriented X 3. No facial asymmetry. No focal deficit. Moves all extremities spontaneously. Psych:  Responds to questions appropriately with a normal affect.     Assessment and Plan  69 year old female with history of nonobstructive coronary disease by cardiac catheterization approximately 2 years ago preserved LV function who presents with complaints of shortness of breath.  She does not appear to be in pulmonary edema at present as her BNP is normal, I chest x-ray shows no significant pulmonary edema as does her chest CT.  There is no evidence of pulmonary embolus.  Symptoms appear to be bronchospastic in nature.  Would continue with bronchodilators.  Will review echo when available to compare to the one done 3 years ago.  We will continue with amiodarone at 200 mg daily which she is on as an outpatient.  We will continue to treat blood pressure with amlodipine at 10 mg daily, clonidine at 0.3 mg twice daily, lisinopril-hydrochlorothiazide 10-12.5 daily along with metoprolol tartrate at 25 mg twice daily.  Her hyperlipidemia will continue be treated with 40 mg of pravastatin.  CHF-symptoms do not appear to be secondary to CHF as chest x-ray, CT and BMP done on admission did not appear to support pulmonary edema.  Would diurese as you are doing although would carefully follow hemodynamics and creatinine as the patient does not appear to be significantly volume overloaded.  Patient is hypokalemic at present with a serum potassium of 3.4.  Respiratory failure-would agree with bronchodilators as you are doing.  Will make further  recommendations after echo is completed.  Signed, Teodoro Spray MD 09/11/2019, 11:37 AM Pager: [phone]

## 2019-09-11 NOTE — ED Notes (Signed)
Purewick in place for patient's comfort after Lasix administration.

## 2019-09-11 NOTE — ED Notes (Signed)
Patient ambulated independently back to stretcher. Patient tolerated ambulation well with use of portable O2. Purewick in place. Patient ate her breakfast, unable to get quanities due to tray being thrown away.

## 2019-09-11 NOTE — ED Triage Notes (Addendum)
Patient c/o SOB X 1 week. Patient tested for COVID last week at [facility] drew with negative results. Patient reports SOB has progressively gotten worse.   Patient orthopneic - reports has to sit straight up to breath.  Patient with bilateral ankle swelling.

## 2019-09-11 NOTE — ED Notes (Addendum)
Patient assisted to room commode for BM. Patient was placed on a portable O2 tank for comfort. Patient has a steady gait.

## 2019-09-11 NOTE — ED Notes (Signed)
Report given Doretha Sou RN

## 2019-09-12 ENCOUNTER — Inpatient Hospital Stay (HOSPITAL_COMMUNITY)
Admit: 2019-09-12 | Discharge: 2019-09-12 | Disposition: A | Discharge status: Home / Self Care | Payer: BC Managed Care – PPO | Attending: Internal Medicine | Admitting: Internal Medicine

## 2019-09-12 ENCOUNTER — Encounter: Payer: Self-pay | Admitting: Internal Medicine

## 2019-09-12 DIAGNOSIS — I5031 Acute diastolic (congestive) heart failure: Secondary | ICD-10-CM

## 2019-09-12 DIAGNOSIS — I472 Ventricular tachycardia: Secondary | ICD-10-CM

## 2019-09-12 DIAGNOSIS — E876 Hypokalemia: Principal | ICD-10-CM

## 2019-09-12 DIAGNOSIS — I340 Nonrheumatic mitral (valve) insufficiency: Secondary | ICD-10-CM

## 2019-09-12 DIAGNOSIS — I10 Essential (primary) hypertension: Secondary | ICD-10-CM

## 2019-09-12 DIAGNOSIS — J9601 Acute respiratory failure with hypoxia: Secondary | ICD-10-CM

## 2019-09-12 LAB — BASIC METABOLIC PANEL
Anion gap: 7 (ref 5–15)
BUN: 31 mg/dL — ABNORMAL HIGH (ref 8–23)
CO2: 29 mmol/L (ref 22–32)
Calcium: 9.2 mg/dL (ref 8.9–10.3)
Chloride: 102 mmol/L (ref 98–111)
Creatinine, Ser: 0.79 mg/dL (ref 0.44–1.00)
GFR calc Af Amer: >60 mL/min (ref >60)
GFR calc non Af Amer: >60 mL/min (ref >60)
Glucose, Bld: 161 mg/dL — ABNORMAL HIGH (ref 70–99)
Potassium: 3.8 mmol/L (ref 3.5–5.1)
Sodium: 138 mmol/L (ref 135–145)

## 2019-09-12 LAB — CBC
HCT: 38.3 % (ref 36.0–46.0)
Hemoglobin: 12.7 g/dL (ref 12.0–15.0)
MCH: 28.2 pg (ref 26.0–34.0)
MCHC: 33.2 g/dL (ref 30.0–36.0)
MCV: 84.9 fL (ref 80.0–100.0)
Platelets: 284 10*3/uL (ref 150–400)
RBC: 4.51 MIL/uL (ref 3.87–5.11)
RDW: 14.2 % (ref 11.5–15.5)
WBC: 12 10*3/uL — ABNORMAL HIGH (ref 4.0–10.5)
nRBC: 0 % (ref 0.0–0.2)

## 2019-09-12 LAB — ECHOCARDIOGRAM COMPLETE
Height: 71 in
Weight: 3684.8 oz

## 2019-09-12 LAB — GLUCOSE, CAPILLARY: Glucose-Capillary: 184 mg/dL — ABNORMAL HIGH (ref 70–99)

## 2019-09-12 MED ORDER — FUROSEMIDE 40 MG PO TABS
40.0000 mg | ORAL_TABLET | Freq: Two times a day (BID) | ORAL | Status: DC
  Administered 2019-09-12 – 2019-09-13 (×2): 40 mg via ORAL
  Filled 2019-09-12 (×2): qty 1

## 2019-09-12 NOTE — Plan of Care (Signed)
  Problem: Education: Goal: Knowledge of General Education information will improve Description: Including pain rating scale, medication(s)/side effects and non-pharmacologic comfort measures Outcome: Progressing   Problem: Health Behavior/Discharge Planning: Goal: Ability to manage health-related needs will improve Outcome: Progressing   Problem: Clinical Measurements: Goal: Ability to maintain clinical measurements within normal limits will improve Outcome: Not Progressing Note: BUN is elevated at 31. Will continue to monitor renal labs for the remainder of the shift. Wenda Low [place]

## 2019-09-12 NOTE — Progress Notes (Signed)
*  PRELIMINARY RESULTS* Echocardiogram 2D Echocardiogram has been performed.  Beth Odonnell 09/12/2019, 9:41 AM

## 2019-09-12 NOTE — Progress Notes (Signed)
Rounded on patient. Patient sleeping. Will continue to monitor. Wenda Low [facility]

## 2019-09-12 NOTE — Progress Notes (Signed)
*  PRELIMINARY RESULTS* Echocardiogram 2D Echocardiogram has been performed.  Sherrie Sport 09/12/2019, 9:41 AM

## 2019-09-12 NOTE — Progress Notes (Addendum)
Progress Note    Beth Odonnell  [id number] DOB: 08-13-1950  DOA: 09/11/2019 PCP: [facility]      Brief Narrative:    Medical records reviewed and are as summarized below:  Beth Odonnell is an 69 y.o. female with medical history significant for coronary artery disease, history of nonsustained ventricular tachycardia, hypertension who presented for evaluation of shortness of breath and bilateral lower extremity swelling for over 1 month but worse in the last 1 week.  Shortness of breath occurred with both exertion and is associated with two-pillow orthopnea and paroxysmal nocturnal dyspnea.  She denies having any chest pain, cough, fever, nausea, vomiting, diaphoresis or palpitations. She denies feeling dizzy or light headed. She denies abdominal pain, changes in bowel habits, urinary frequency, nocturia or dysuria.  Patient had a negative Covid test but a week ago.  Her pulse oximetry on room air at rest was 89% and improved following oxygen supplementation at 2 L.  She had significantly elevated blood pressure in the emergency room and admitted to occasional noncompliance with her medications and diet.      Assessment/Plan:   Active Problems:   Hypokalemia   NSVT (nonsustained ventricular tachycardia) (HCC)   Essential hypertension   Acute respiratory failure with hypoxia (HCC)   Shortness of breath: Differential diagnosis include pneumonia, acute diastolic CHF,  COPD exacerbation.  Suspect patient has underlying undiagnosed COPD given extensive smoking history. Outpatient follow-up with pulmonologist for pulmonary function test strongly recommended.    2D echo showed EF estimated at 55 to 60%, moderate LVH, indeterminate ventricular diastolic parameters.  She has not had any antibiotics but symptoms are improving. No antibiotics for now.  Continue Lasix and bronchodilators.  Acute hypoxemic respiratory failure:  Improved.  She was previously  on 4 L but she is now tolerating room air. Check oxygen saturation with ambulation.  Hypertension: Continue antihypertensives  CAD: Continue aspirin, statin  NSVT: On amiodarone.  Tobacco use disorder: Patient said she has been smoking since she was a teenager.  Counseled to quit smoking.   Body mass index is 32.12 kg/m.  (Obesity)     Family Communication/Anticipated D/C date and plan/Code Status   DVT prophylaxis: Lovenox Code Status: Full code Family Communication: Plan discussed with the patient Disposition Plan: Patient is from home. Plan to discharge home possibly tomorrow.      Subjective:   C/o "cough spasm", wheezing and shortness of breath.  She said she had a lot of swelling in her legs but the swelling is pretty much gone after IV Lasix.  She said she had trouble laying down flat at night.  Objective:    Vitals:   09/12/19 0748 09/12/19 0838 09/12/19 1000 09/12/19 1153  BP: 123/86   137/71  Pulse: 70   65  Resp: 19   19  Temp: 97.7 F (36.5 C)   97.6 F (36.4 C)  TempSrc: Oral     SpO2: 99% 95% (!) 89% 98%  Weight:      Height:        Intake/Output Summary (Last 24 hours) at 09/12/2019 1355 Last data filed at 09/12/2019 1330 Gross per 24 hour  Intake 480 ml  Output 1350 ml  Net -870 ml   Filed Weights   09/11/19 0451 09/11/19 1612  Weight: 106.1 kg 104.5 kg    Exam:  GEN: NAD SKIN: No rash EYES: EOMI ENT: MMM CV: RRR PULM: b/l expiratory wheezing, no rales heard ABD: soft, ND,  NT, +BS CNS: AAO x 3, non focal EXT: No edema or tenderness   Data Reviewed:   I have personally reviewed following labs and imaging studies:  Labs: Labs show the following:   Basic Metabolic Panel: Recent Labs  Lab 09/11/19 0544 09/12/19 0326  NA 141 138  K 3.4* 3.8  CL 100 102  CO2 29 29  GLUCOSE 169* 161*  BUN 15 31*  CREATININE 0.57 0.79  CALCIUM 9.1 9.2   GFR Estimated Creatinine Clearance: 90.8 mL/min (by C-G formula based on SCr of  0.79 mg/dL). Liver Function Tests: Recent Labs  Lab 09/11/19 0544  AST 24  ALT 30  ALKPHOS 76  BILITOT 0.5  PROT 8.0  ALBUMIN 4.0   No results for input(s): LIPASE, AMYLASE in the last 168 hours. No results for input(s): AMMONIA in the last 168 hours. Coagulation profile No results for input(s): INR, PROTIME in the last 168 hours.  CBC: Recent Labs  Lab 09/11/19 0458 09/11/19 1145 09/12/19 0326  WBC 12.5* 11.5* 12.0*  NEUTROABS 8.2*  --   --   HGB 14.4 14.3 12.7  HCT 43.6 43.9 38.3  MCV 85.7 86.8 84.9  PLT 300 294 284   Cardiac Enzymes: No results for input(s): CKTOTAL, CKMB, CKMBINDEX, TROPONINI in the last 168 hours. BNP (last 3 results) No results for input(s): PROBNP in the last 8760 hours. CBG: No results for input(s): GLUCAP in the last 168 hours. D-Dimer: No results for input(s): DDIMER in the last 72 hours. Hgb A1c: No results for input(s): HGBA1C in the last 72 hours. Lipid Profile: No results for input(s): CHOL, HDL, LDLCALC, TRIG, CHOLHDL, LDLDIRECT in the last 72 hours. Thyroid function studies: No results for input(s): TSH, T4TOTAL, T3FREE, THYROIDAB in the last 72 hours.  Invalid input(s): FREET3 Anemia work up: No results for input(s): VITAMINB12, FOLATE, FERRITIN, TIBC, IRON, RETICCTPCT in the last 72 hours. Sepsis Labs: Recent Labs  Lab 09/11/19 0458 09/11/19 0544 09/11/19 1145 09/12/19 0326  PROCALCITON  --  <0.10  --   --   WBC 12.5*  --  11.5* 12.0*    Microbiology Recent Results (from the past 240 hour(s))  Respiratory Panel by RT PCR (Flu A&B, Covid) - Nasopharyngeal Swab     Status: None   Collection Time: 09/11/19  8:10 AM   Specimen: Nasopharyngeal Swab  Result Value Ref Range Status   SARS Coronavirus 2 by RT PCR NEGATIVE NEGATIVE Final    Comment: (NOTE) SARS-CoV-2 target nucleic acids are NOT DETECTED. The SARS-CoV-2 RNA is generally detectable in upper respiratoy specimens during the acute phase of infection. The  lowest concentration of SARS-CoV-2 viral copies this assay can detect is 131 copies/mL. A negative result does not preclude SARS-Cov-2 infection and should not be used as the sole basis for treatment or other patient management decisions. A negative result may occur with  improper specimen collection/handling, submission of specimen other than nasopharyngeal swab, presence of viral mutation(s) within the areas targeted by this assay, and inadequate number of viral copies (<131 copies/mL). A negative result must be combined with clinical observations, patient history, and epidemiological information. The expected result is Negative. Fact Sheet for Patients:  PinkCheek.be Fact Sheet for Healthcare Providers:  GravelBags.it This test is not yet ap proved or cleared by the [place] FDA and  has been authorized for detection and/or diagnosis of SARS-CoV-2 by FDA under an Emergency Use Authorization (EUA). This EUA will remain  in effect (meaning this test can be used)  for the duration of the COVID-19 declaration under Section 564(b)(1) of the Act, 21 U.S.C. section 360bbb-3(b)(1), unless the authorization is terminated or revoked sooner.    Influenza A by PCR NEGATIVE NEGATIVE Final   Influenza B by PCR NEGATIVE NEGATIVE Final    Comment: (NOTE) The Xpert Xpress SARS-CoV-2/FLU/RSV assay is intended as an aid in  the diagnosis of influenza from Nasopharyngeal swab specimens and  should not be used as a sole basis for treatment. Nasal washings and  aspirates are unacceptable for Xpert Xpress SARS-CoV-2/FLU/RSV  testing. Fact Sheet for Patients: PinkCheek.be Fact Sheet for Healthcare Providers: GravelBags.it This test is not yet approved or cleared by the [place] FDA and  has been authorized for detection and/or diagnosis of SARS-CoV-2 by  FDA under an Emergency  Use Authorization (EUA). This EUA will remain  in effect (meaning this test can be used) for the duration of the  Covid-19 declaration under Section 564(b)(1) of the Act, 21  U.S.C. section 360bbb-3(b)(1), unless the authorization is  terminated or revoked. Performed at [facility], [address] [place], [place] [address]     Procedures and diagnostic studies:  CT Angio Chest PE W and/or Wo Contrast  Result Date: 09/11/2019 CLINICAL DATA:  69 year old female with history of hypoxemia. Shortness of breath for 1 week, progressively worsening. EXAM: CT ANGIOGRAPHY CHEST WITH CONTRAST TECHNIQUE: Multidetector CT imaging of the chest was performed using the standard protocol during bolus administration of intravenous contrast. Multiplanar CT image reconstructions and MIPs were obtained to evaluate the vascular anatomy. CONTRAST:  54mL OMNIPAQUE IOHEXOL 350 MG/ML SOLN COMPARISON:  Chest CT 03/02/2008. FINDINGS: Cardiovascular: No filling defects within the pulmonary arterial tree to suggest underlying pulmonary embolism. Heart size is mildly enlarged. There is no significant pericardial fluid, thickening or pericardial calcification. There is aortic atherosclerosis, as well as atherosclerosis of the great vessels of the mediastinum and the coronary arteries, including calcified atherosclerotic plaque in the left main, left anterior descending and right coronary arteries. Mediastinum/Nodes: No pathologically enlarged mediastinal or hilar lymph nodes. Esophagus is unremarkable in appearance. No axillary lymphadenopathy. Lungs/Pleura: Patchy multifocal areas of ground-glass attenuation are noted in the lungs bilaterally, most evident throughout the upper lungs. No pleural effusions. No definite suspicious appearing pulmonary nodules or masses are noted. Upper Abdomen: Unremarkable. Musculoskeletal: There are no aggressive appearing lytic or blastic lesions noted in the visualized portions of the  skeleton. Review of the MIP images confirms the above findings. IMPRESSION: 1. No evidence of pulmonary embolism. 2. Patchy multifocal areas of ground-glass attenuation noted throughout the upper lungs bilaterally. Findings are concerning for multilobar atypical pneumonia. The upper lung predominance of this finding would be unusual for COVID-19 infection, however, this remains in the differential diagnosis. Clinical correlation for signs and symptoms of atypical pneumonia is recommended. 3. Aortic atherosclerosis, in addition to left main and 2 vessel coronary artery disease. Please note that although the presence of coronary artery calcium documents the presence of coronary artery disease, the severity of this disease and any potential stenosis cannot be assessed on this non-gated CT examination. Assessment for potential risk factor modification, dietary therapy or pharmacologic therapy may be warranted, if clinically indicated. Aortic Atherosclerosis (ICD10-I70.0). Electronically Signed   By: Vinnie Langton M.D.   On: 09/11/2019 07:17   DG Chest Portable 1 View  Result Date: 09/11/2019 CLINICAL DATA:  69 year old female with history of shortness of breath for 1 week. Tested positive for COVID-19 last week. EXAM: PORTABLE CHEST 1 VIEW  COMPARISON:  Chest x-ray 09/05/2019. FINDINGS: Lung volumes are normal. Scattered areas with peribronchial cuffing and mild interstitial prominence, similar to the prior study. No consolidative airspace disease. No pleural effusions. No pneumothorax. No pulmonary nodule or mass noted. Pulmonary vasculature and the cardiomediastinal silhouette are within normal limits. Atherosclerosis in the thoracic aorta. IMPRESSION: 1. No radiographic evidence of acute cardiopulmonary disease. 2. Mild peribronchial cuffing and interstitial prominence similar to prior examinations, potentially related to chronic bronchitis. 3. Aortic atherosclerosis. Electronically Signed   By: Vinnie Langton  M.D.   On: 09/11/2019 05:30   ECHOCARDIOGRAM COMPLETE  Result Date: 09/12/2019    ECHOCARDIOGRAM REPORT   Patient Name:   CHAUNDA WASSINK Date of Exam: 09/12/2019 Medical Rec #:  [id number]       Height:       71.0 in Accession #:    [id number]      Weight:       230.3 lb Date of Birth:  04/16/1951       BSA:          2.239 m Patient Age:    59 years        BP:           123/86 mmHg Patient Gender: F               HR:           70 bpm. Exam Location:  [facility] Procedure: 2D Echo, Cardiac Doppler and Color Doppler Indications:     CHF 428.0  History:         Patient has prior history of Echocardiogram examinations, most                  recent 07/28/2016. Risk Factors:Hypertension. Irregular heart                  beat.  Sonographer:     Sherrie Sport RDCS (AE) Referring Phys:  [place] Diagnosing Phys: Kathlyn Sacramento MD IMPRESSIONS  1. Left ventricular ejection fraction, by estimation, is 55 to 60%. The left ventricle has normal function. The left ventricle has no regional wall motion abnormalities. There is moderate left ventricular hypertrophy. Left ventricular diastolic parameters are indeterminate.  2. Right ventricular systolic function is normal. The right ventricular size is normal. Tricuspid regurgitation signal is inadequate for assessing PA pressure.  3. The mitral valve is normal in structure. Mild mitral valve regurgitation. No evidence of mitral stenosis.  4. The aortic valve is normal in structure. Aortic valve regurgitation is not visualized. No aortic stenosis is present.  5. The inferior vena cava is dilated in size with >50% respiratory variability, suggesting right atrial pressure of 8 mmHg. FINDINGS  Left Ventricle: Left ventricular ejection fraction, by estimation, is 55 to 60%. The left ventricle has normal function. The left ventricle has no regional wall motion abnormalities. The left ventricular internal cavity size was normal in size. There is  moderate left ventricular hypertrophy.  Left ventricular diastolic parameters are indeterminate. Right Ventricle: The right ventricular size is normal. No increase in right ventricular wall thickness. Right ventricular systolic function is normal. Tricuspid regurgitation signal is inadequate for assessing PA pressure. The tricuspid regurgitant velocity is 2.50 m/s, and with an assumed right atrial pressure of 10 mmHg, the estimated right ventricular systolic pressure is [id number] mmHg. Left Atrium: Left atrial size was normal in size. Right Atrium: Right atrial size was normal in size. Pericardium: There is no evidence of pericardial effusion. Mitral Valve:  The mitral valve is normal in structure. Normal mobility of the mitral valve leaflets. Mild mitral valve regurgitation. No evidence of mitral valve stenosis. Tricuspid Valve: The tricuspid valve is normal in structure. Tricuspid valve regurgitation is trivial. No evidence of tricuspid stenosis. Aortic Valve: The aortic valve is normal in structure. Aortic valve regurgitation is not visualized. No aortic stenosis is present. Aortic valve mean gradient measures 4.5 mmHg. Aortic valve peak gradient measures 8.1 mmHg. Aortic valve area, by VTI measures 2.47 cm. Pulmonic Valve: The pulmonic valve was normal in structure. Pulmonic valve regurgitation is not visualized. No evidence of pulmonic stenosis. Aorta: The aortic root is normal in size and structure. Venous: The inferior vena cava is dilated in size with greater than 50% respiratory variability, suggesting right atrial pressure of 8 mmHg. IAS/Shunts: No atrial level shunt detected by color flow Doppler.  LEFT VENTRICLE PLAX 2D LVIDd:         4.77 cm  Diastology LVIDs:         3.44 cm  LV e' lateral:   8.38 cm/s LV PW:         1.22 cm  LV E/e' lateral: 12.6 LV IVS:        1.14 cm  LV e' medial:    7.83 cm/s LVOT diam:     2.00 cm  LV E/e' medial:  13.5 LV SV:         79 LV SV Index:   36 LVOT Area:     3.14 cm  RIGHT VENTRICLE RV Basal diam:  3.60 cm RV S  prime:     13.90 cm/s TAPSE (M-mode): 3.1 cm LEFT ATRIUM             Index       RIGHT ATRIUM           Index LA diam:        2.80 cm 1.25 cm/m  RA Area:     19.10 cm LA Vol (A2C):   89.3 ml 39.89 ml/m RA Volume:   60.80 ml  27.16 ml/m LA Vol (A4C):   36.0 ml 16.08 ml/m LA Biplane Vol: 57.5 ml 25.68 ml/m  AORTIC VALVE                   PULMONIC VALVE AV Area (Vmax):    2.63 cm    PV Vmax:        0.96 m/s AV Area (Vmean):   1.95 cm    PV Peak grad:   3.7 mmHg AV Area (VTI):     2.47 cm    RVOT Peak grad: 4 mmHg AV Vmax:           142.00 cm/s AV Vmean:          97.150 cm/s AV VTI:            0.322 m AV Peak Grad:      8.1 mmHg AV Mean Grad:      4.5 mmHg LVOT Vmax:         119.00 cm/s LVOT Vmean:        60.200 cm/s LVOT VTI:          0.253 m LVOT/AV VTI ratio: 0.79  AORTA Ao Root diam: 3.00 cm MITRAL VALVE                TRICUSPID VALVE MV Area (PHT): 3.27 cm     TR Peak grad:   25.0 mmHg MV Decel Time: 232  msec     TR Vmax:        250.00 cm/s MV E velocity: 106.00 cm/s MV A velocity: 79.50 cm/s   SHUNTS MV E/A ratio:  1.33         Systemic VTI:  0.25 m                             Systemic Diam: 2.00 cm Kathlyn Sacramento MD Electronically signed by Kathlyn Sacramento MD Signature Date/Time: 09/12/2019/10:35:33 AM    Final     Medications:   . amiodarone  200 mg Oral Daily  . amLODipine  10 mg Oral Daily  . aspirin EC  81 mg Oral Daily  . budesonide (PULMICORT) nebulizer solution  0.25 mg Nebulization BID  . cloNIDine  0.3 mg Oral Daily  . enoxaparin (LOVENOX) injection  40 mg Subcutaneous Q24H  . furosemide  40 mg Oral BID  . lisinopril  10 mg Oral Daily  . loratadine  10 mg Oral Daily  . metoprolol tartrate  25 mg Oral BID  . multivitamin with minerals  1 tablet Oral Daily  . potassium chloride  40 mEq Oral Daily  . pravastatin  40 mg Oral QHS  . sodium chloride flush  3 mL Intravenous Q12H   Continuous Infusions: . sodium chloride       LOS: 1 day   Taiya Nutting  [facility]      09/12/2019, 1:55 PM

## 2019-09-12 NOTE — Progress Notes (Signed)
Patient Name: Beth Odonnell Date of Encounter: 09/12/2019  Hospital Problem List     Active Problems:   Hypokalemia   NSVT (nonsustained ventricular tachycardia) (HCC)   Essential hypertension   Acute respiratory failure with hypoxia [facility])    Patient Profile     Patient admitted with increasing shortness of breath.  No obvious significant pulmonary edema.  Improved with bronchodilators.  Subjective   Less short of breath today.  Inpatient Medications    . amiodarone  200 mg Oral Daily  . amLODipine  10 mg Oral Daily  . aspirin EC  81 mg Oral Daily  . budesonide (PULMICORT) nebulizer solution  0.25 mg Nebulization BID  . cloNIDine  0.3 mg Oral Daily  . enoxaparin (LOVENOX) injection  40 mg Subcutaneous Q24H  . furosemide  40 mg Oral BID  . lisinopril  10 mg Oral Daily  . loratadine  10 mg Oral Daily  . metoprolol tartrate  25 mg Oral BID  . multivitamin with minerals  1 tablet Oral Daily  . potassium chloride  40 mEq Oral Daily  . pravastatin  40 mg Oral QHS  . sodium chloride flush  3 mL Intravenous Q12H    Vital Signs    Vitals:   09/12/19 0748 09/12/19 0838 09/12/19 1000 09/12/19 1153  BP: 123/86   137/71  Pulse: 70   65  Resp: 19   19  Temp: 97.7 F (36.5 C)   97.6 F (36.4 C)  TempSrc: Oral     SpO2: 99% 95% (!) 89% 98%  Weight:      Height:        Intake/Output Summary (Last 24 hours) at 09/12/2019 1546 Last data filed at 09/12/2019 1414 Gross per 24 hour  Intake 960 ml  Output 1350 ml  Net -390 ml   Filed Weights   09/11/19 0451 09/11/19 1612  Weight: 106.1 kg 104.5 kg    Physical Exam    GEN: Well nourished, well developed, in no acute distress.  HEENT: normal.  Neck: Supple, no JVD, carotid bruits, or masses. Cardiac: RRR, no murmurs, rubs, or gallops. No clubbing, cyanosis, edema.  Radials/DP/PT 2+ and equal bilaterally.  Respiratory:  Respirations regular and unlabored, clear to auscultation bilaterally. GI: Soft, nontender,  nondistended, BS + x 4. MS: no deformity or atrophy. Skin: warm and dry, no rash. Neuro:  Strength and sensation are intact. Psych: Normal affect.  Labs    CBC Recent Labs    09/11/19 0458 09/11/19 0458 09/11/19 1145 09/12/19 0326  WBC 12.5*   < > 11.5* 12.0*  NEUTROABS 8.2*  --   --   --   HGB 14.4   < > 14.3 12.7  HCT 43.6   < > 43.9 38.3  MCV 85.7   < > 86.8 84.9  PLT 300   < > 294 284   < > = values in this interval not displayed.   Basic Metabolic Panel Recent Labs    09/11/19 0544 09/12/19 0326  NA 141 138  K 3.4* 3.8  CL 100 102  CO2 29 29  GLUCOSE 169* 161*  BUN 15 31*  CREATININE 0.57 0.79  CALCIUM 9.1 9.2   Liver Function Tests Recent Labs    09/11/19 0544  AST 24  ALT 30  ALKPHOS 76  BILITOT 0.5  PROT 8.0  ALBUMIN 4.0   No results for input(s): LIPASE, AMYLASE in the last 72 hours. Cardiac Enzymes No results for input(s): CKTOTAL, CKMB, CKMBINDEX,  TROPONINI in the last 72 hours. BNP Recent Labs    09/11/19 0458  BNP 34.0   D-Dimer No results for input(s): DDIMER in the last 72 hours. Hemoglobin A1C No results for input(s): HGBA1C in the last 72 hours. Fasting Lipid Panel No results for input(s): CHOL, HDL, LDLCALC, TRIG, CHOLHDL, LDLDIRECT in the last 72 hours. Thyroid Function Tests No results for input(s): TSH, T4TOTAL, T3FREE, THYROIDAB in the last 72 hours.  Invalid input(s): FREET3  Telemetry    Sinus rhythm  ECG    Sinus rhythm with no ischemia  Radiology    DG Chest 2 View  Result Date: 09/05/2019 CLINICAL DATA:  Shortness of breath, current smoker EXAM: CHEST - 2 VIEW COMPARISON:  Radiograph 07/27/2016, CT 03/02/2008 FINDINGS: Hyperinflation of lungs with flattening of the diaphragms and coarsened interstitial changes. No consolidation, features of edema, pneumothorax, or effusion. The cardiomediastinal contours are unremarkable. No acute osseous or soft tissue abnormality. Degenerative changes are present in the imaged  spine and shoulders. IMPRESSION: Lung hyperinflation and coarsened interstitium, may reflect smoking related changes. No other acute cardiopulmonary abnormality. Electronically Signed   By: Lovena Le M.D.   On: 09/05/2019 20:04   CT Angio Chest PE W and/or Wo Contrast  Result Date: 09/11/2019 CLINICAL DATA:  69 year old female with history of hypoxemia. Shortness of breath for 1 week, progressively worsening. EXAM: CT ANGIOGRAPHY CHEST WITH CONTRAST TECHNIQUE: Multidetector CT imaging of the chest was performed using the standard protocol during bolus administration of intravenous contrast. Multiplanar CT image reconstructions and MIPs were obtained to evaluate the vascular anatomy. CONTRAST:  57mL OMNIPAQUE IOHEXOL 350 MG/ML SOLN COMPARISON:  Chest CT 03/02/2008. FINDINGS: Cardiovascular: No filling defects within the pulmonary arterial tree to suggest underlying pulmonary embolism. Heart size is mildly enlarged. There is no significant pericardial fluid, thickening or pericardial calcification. There is aortic atherosclerosis, as well as atherosclerosis of the great vessels of the mediastinum and the coronary arteries, including calcified atherosclerotic plaque in the left main, left anterior descending and right coronary arteries. Mediastinum/Nodes: No pathologically enlarged mediastinal or hilar lymph nodes. Esophagus is unremarkable in appearance. No axillary lymphadenopathy. Lungs/Pleura: Patchy multifocal areas of ground-glass attenuation are noted in the lungs bilaterally, most evident throughout the upper lungs. No pleural effusions. No definite suspicious appearing pulmonary nodules or masses are noted. Upper Abdomen: Unremarkable. Musculoskeletal: There are no aggressive appearing lytic or blastic lesions noted in the visualized portions of the skeleton. Review of the MIP images confirms the above findings. IMPRESSION: 1. No evidence of pulmonary embolism. 2. Patchy multifocal areas of ground-glass  attenuation noted throughout the upper lungs bilaterally. Findings are concerning for multilobar atypical pneumonia. The upper lung predominance of this finding would be unusual for COVID-19 infection, however, this remains in the differential diagnosis. Clinical correlation for signs and symptoms of atypical pneumonia is recommended. 3. Aortic atherosclerosis, in addition to left main and 2 vessel coronary artery disease. Please note that although the presence of coronary artery calcium documents the presence of coronary artery disease, the severity of this disease and any potential stenosis cannot be assessed on this non-gated CT examination. Assessment for potential risk factor modification, dietary therapy or pharmacologic therapy may be warranted, if clinically indicated. Aortic Atherosclerosis (ICD10-I70.0). Electronically Signed   By: Vinnie Langton M.D.   On: 09/11/2019 07:17   DG Chest Portable 1 View  Result Date: 09/11/2019 CLINICAL DATA:  69 year old female with history of shortness of breath for 1 week. Tested positive for COVID-19 last week.  EXAM: PORTABLE CHEST 1 VIEW COMPARISON:  Chest x-ray 09/05/2019. FINDINGS: Lung volumes are normal. Scattered areas with peribronchial cuffing and mild interstitial prominence, similar to the prior study. No consolidative airspace disease. No pleural effusions. No pneumothorax. No pulmonary nodule or mass noted. Pulmonary vasculature and the cardiomediastinal silhouette are within normal limits. Atherosclerosis in the thoracic aorta. IMPRESSION: 1. No radiographic evidence of acute cardiopulmonary disease. 2. Mild peribronchial cuffing and interstitial prominence similar to prior examinations, potentially related to chronic bronchitis. 3. Aortic atherosclerosis. Electronically Signed   By: Vinnie Langton M.D.   On: 09/11/2019 05:30   ECHOCARDIOGRAM COMPLETE  Result Date: 09/12/2019    ECHOCARDIOGRAM REPORT   Patient Name:   LORANA BIDDLE Date of Exam:  09/12/2019 Medical Rec #:  [id number]       Height:       71.0 in Accession #:    [id number]      Weight:       230.3 lb Date of Birth:  1950/07/29       BSA:          2.239 m Patient Age:    7 years        BP:           123/86 mmHg Patient Gender: F               HR:           70 bpm. Exam Location:  [facility] Procedure: 2D Echo, Cardiac Doppler and Color Doppler Indications:     CHF 428.0  History:         Patient has prior history of Echocardiogram examinations, most                  recent 07/28/2016. Risk Factors:Hypertension. Irregular heart                  beat.  Sonographer:     Sherrie Sport RDCS (AE) Referring Phys:  [place] Diagnosing Phys: Kathlyn Sacramento MD IMPRESSIONS  1. Left ventricular ejection fraction, by estimation, is 55 to 60%. The left ventricle has normal function. The left ventricle has no regional wall motion abnormalities. There is moderate left ventricular hypertrophy. Left ventricular diastolic parameters are indeterminate.  2. Right ventricular systolic function is normal. The right ventricular size is normal. Tricuspid regurgitation signal is inadequate for assessing PA pressure.  3. The mitral valve is normal in structure. Mild mitral valve regurgitation. No evidence of mitral stenosis.  4. The aortic valve is normal in structure. Aortic valve regurgitation is not visualized. No aortic stenosis is present.  5. The inferior vena cava is dilated in size with >50% respiratory variability, suggesting right atrial pressure of 8 mmHg. FINDINGS  Left Ventricle: Left ventricular ejection fraction, by estimation, is 55 to 60%. The left ventricle has normal function. The left ventricle has no regional wall motion abnormalities. The left ventricular internal cavity size was normal in size. There is  moderate left ventricular hypertrophy. Left ventricular diastolic parameters are indeterminate. Right Ventricle: The right ventricular size is normal. No increase in right ventricular wall  thickness. Right ventricular systolic function is normal. Tricuspid regurgitation signal is inadequate for assessing PA pressure. The tricuspid regurgitant velocity is 2.50 m/s, and with an assumed right atrial pressure of 10 mmHg, the estimated right ventricular systolic pressure is [id number] mmHg. Left Atrium: Left atrial size was normal in size. Right Atrium: Right atrial size was normal in size. Pericardium: There is no evidence  of pericardial effusion. Mitral Valve: The mitral valve is normal in structure. Normal mobility of the mitral valve leaflets. Mild mitral valve regurgitation. No evidence of mitral valve stenosis. Tricuspid Valve: The tricuspid valve is normal in structure. Tricuspid valve regurgitation is trivial. No evidence of tricuspid stenosis. Aortic Valve: The aortic valve is normal in structure. Aortic valve regurgitation is not visualized. No aortic stenosis is present. Aortic valve mean gradient measures 4.5 mmHg. Aortic valve peak gradient measures 8.1 mmHg. Aortic valve area, by VTI measures 2.47 cm. Pulmonic Valve: The pulmonic valve was normal in structure. Pulmonic valve regurgitation is not visualized. No evidence of pulmonic stenosis. Aorta: The aortic root is normal in size and structure. Venous: The inferior vena cava is dilated in size with greater than 50% respiratory variability, suggesting right atrial pressure of 8 mmHg. IAS/Shunts: No atrial level shunt detected by color flow Doppler.  LEFT VENTRICLE PLAX 2D LVIDd:         4.77 cm  Diastology LVIDs:         3.44 cm  LV e' lateral:   8.38 cm/s LV PW:         1.22 cm  LV E/e' lateral: 12.6 LV IVS:        1.14 cm  LV e' medial:    7.83 cm/s LVOT diam:     2.00 cm  LV E/e' medial:  13.5 LV SV:         79 LV SV Index:   36 LVOT Area:     3.14 cm  RIGHT VENTRICLE RV Basal diam:  3.60 cm RV S prime:     13.90 cm/s TAPSE (M-mode): 3.1 cm LEFT ATRIUM             Index       RIGHT ATRIUM           Index LA diam:        2.80 cm 1.25 cm/m  RA  Area:     19.10 cm LA Vol (A2C):   89.3 ml 39.89 ml/m RA Volume:   60.80 ml  27.16 ml/m LA Vol (A4C):   36.0 ml 16.08 ml/m LA Biplane Vol: 57.5 ml 25.68 ml/m  AORTIC VALVE                   PULMONIC VALVE AV Area (Vmax):    2.63 cm    PV Vmax:        0.96 m/s AV Area (Vmean):   1.95 cm    PV Peak grad:   3.7 mmHg AV Area (VTI):     2.47 cm    RVOT Peak grad: 4 mmHg AV Vmax:           142.00 cm/s AV Vmean:          97.150 cm/s AV VTI:            0.322 m AV Peak Grad:      8.1 mmHg AV Mean Grad:      4.5 mmHg LVOT Vmax:         119.00 cm/s LVOT Vmean:        60.200 cm/s LVOT VTI:          0.253 m LVOT/AV VTI ratio: 0.79  AORTA Ao Root diam: 3.00 cm MITRAL VALVE                TRICUSPID VALVE MV Area (PHT): 3.27 cm     TR Peak grad:   25.0  mmHg MV Decel Time: 232 msec     TR Vmax:        250.00 cm/s MV E velocity: 106.00 cm/s MV A velocity: 79.50 cm/s   SHUNTS MV E/A ratio:  1.33         Systemic VTI:  0.25 m                             Systemic Diam: 2.00 cm Kathlyn Sacramento MD Electronically signed by Kathlyn Sacramento MD Signature Date/Time: 09/12/2019/10:35:33 AM    Final     Assessment & Plan    69 year old female with history of nonobstructive coronary disease by cardiac catheterization approximately 2 years ago preserved LV function who presents with complaints of shortness of breath.  She does not appear to be in pulmonary edema at present as her BNP is normal, I chest x-ray shows no significant pulmonary edema as does her chest CT.  There is no evidence of pulmonary embolus.  Symptoms appear to be bronchospastic in nature.  Would continue with bronchodilators.    Echo done today revealed normal LV function EF 55 to 60% with LVH.  No significant valvular abnormalities..  We will continue with amiodarone at 200 mg daily which she is on as an outpatient.  We will continue to treat blood pressure with amlodipine at 10 mg daily, clonidine at 0.3 mg twice daily, lisinopril-hydrochlorothiazide 10-12.5 daily  along with metoprolol tartrate at 25 mg twice daily.  Her hyperlipidemia will continue be treated with 40 mg of pravastatin.  CHF-symptoms do not appear to be secondary to CHF as chest x-ray, CT and BMP done on admission did not appear to support pulmonary edema.  Would diurese as you are doing although would carefully follow hemodynamics and creatinine as the patient does not appear to be significantly volume overloaded.  Patient is hypokalemic at present with a serum potassium of 3. 8.  Respiratory failure-would agree with bronchodilators as you are doing.     Signed, Javier Docker Carsyn Taubman MD 09/12/2019, 3:46 PM  Pager: [phone]

## 2019-09-13 DIAGNOSIS — E1165 Type 2 diabetes mellitus with hyperglycemia: Secondary | ICD-10-CM | POA: Diagnosis present

## 2019-09-13 LAB — THYROID PANEL WITH TSH
Free Thyroxine Index: 2.1 (ref 1.2–4.9)
T3 Uptake Ratio: 30 % (ref 24–39)
T4, Total: 7 ug/dL (ref 4.5–12.0)
TSH: 0.145 u[IU]/mL — ABNORMAL LOW (ref 0.450–4.500)

## 2019-09-13 NOTE — Discharge Summary (Addendum)
Physician Discharge Summary  Beth Odonnell [id number] DOB: July 14, 1950 DOA: 09/11/2019  PCP: [facility] [place] date: 09/11/2019 Discharge date: 09/13/2019  Discharge disposition: Home   Recommendations for Outpatient Follow-Up:   Outpatient follow-up with PCP   Discharge Diagnosis:   Active Problems:   Hypokalemia   NSVT (nonsustained ventricular tachycardia) (HCC)   Essential hypertension   Acute respiratory failure with hypoxia ([place])   Type 2 diabetes mellitus with hyperglycemia ([place])    Discharge Condition: Stable.  Diet recommendation: Low-salt diet  Code status: Full code.    Hospital Course:   Beth Odonnell is a 69 y.o. female with medical history significant for coronary artery disease, history of nonsustained ventricular tachycardia, hypertension, diet-controlled type 2 diabetes mellitus, tobacco use disorder (over 50-pack-year smoking history) who presented for evaluation of shortness of breath and bilateral lower extremity swelling for over 1 month but worse in the last 1 week prior to admission.  Shortness of breath occurred with both exertion and is associated with two-pillow orthopnea and paroxysmal nocturnal dyspnea.   Patient was hypoxic on admission and was requiring 4 L of oxygen via nasal cannula.  Chest x-ray showed findings concerning for chronic bronchitis and CT chest was suspicious for pneumonia.  2D echo showed EF estimated at 55 to 60%, moderate LVH but LV diastolic parameters were indeterminate.  Differential diagnosis were acute diastolic CHF and COPD exacerbation but clinical features are more consistent acute CHF.  She was treated with IV Lasix and steroids and her symptoms improved significantly.  Bilateral leg edema, shortness of breath and orthopnea have resolved.  She was successfully weaned off of oxygen and was tolerating room air even with ambulation.  Pneumonia was unlikely since patient's condition improved  without any antibiotics.  Her condition has improved and she is deemed stable for discharge to home.  She has been advised to quit smoking cigarettes.  She should follow-up with PCP and pulmonologist for pulmonary function study and routine health maintenance.    Discharge Exam:   Vitals:   09/13/19 0749 09/13/19 1148  BP: 124/79 (!) 103/54  Pulse: (!) 59 (!) 54  Resp: 17 17  Temp: 98.6 F (37 C) 97.7 F (36.5 C)  SpO2: 95% 96%   Vitals:   09/13/19 0505 09/13/19 0735 09/13/19 0749 09/13/19 1148  BP: 124/65  124/79 (!) 103/54  Pulse: 68 (!) 51 (!) 59 (!) 54  Resp: 18 16 17 17   Temp: 98 F (36.7 C)  98.6 F (37 C) 97.7 F (36.5 C)  TempSrc: Oral  Oral Oral  SpO2: 94% 93% 95% 96%  Weight: 104.9 kg     Height:         GEN: NAD SKIN: No rash EYES: EOMI ENT: MMM CV: RRR PULM: Mild bibasilar rales but no wheezing ABD: soft, ND, NT, +BS CNS: AAO x 3, non focal EXT: No edema or tenderness   The results of significant diagnostics from this hospitalization (including imaging, microbiology, ancillary and laboratory) are listed below for reference.     Procedures and Diagnostic Studies:   ECHOCARDIOGRAM COMPLETE  Result Date: 09/12/2019    ECHOCARDIOGRAM REPORT   Patient Name:   Beth Odonnell Date of Exam: 09/12/2019 Medical Rec #:  [id number]       Height:       71.0 in Accession #:    [id number]      Weight:       230.3 lb Date of Birth:  1950-10-09  BSA:          2.239 m Patient Age:    69 years        BP:           123/86 mmHg Patient Gender: F               HR:           70 bpm. Exam Location:  [facility] Procedure: 2D Echo, Cardiac Doppler and Color Doppler Indications:     CHF 428.0  History:         Patient has prior history of Echocardiogram examinations, most                  recent 07/28/2016. Risk Factors:Hypertension. Irregular heart                  beat.  Sonographer:     Sherrie Sport RDCS (AE) Referring Phys:  [place] Diagnosing Phys: Kathlyn Sacramento MD  IMPRESSIONS  1. Left ventricular ejection fraction, by estimation, is 55 to 60%. The left ventricle has normal function. The left ventricle has no regional wall motion abnormalities. There is moderate left ventricular hypertrophy. Left ventricular diastolic parameters are indeterminate.  2. Right ventricular systolic function is normal. The right ventricular size is normal. Tricuspid regurgitation signal is inadequate for assessing PA pressure.  3. The mitral valve is normal in structure. Mild mitral valve regurgitation. No evidence of mitral stenosis.  4. The aortic valve is normal in structure. Aortic valve regurgitation is not visualized. No aortic stenosis is present.  5. The inferior vena cava is dilated in size with >50% respiratory variability, suggesting right atrial pressure of 8 mmHg. FINDINGS  Left Ventricle: Left ventricular ejection fraction, by estimation, is 55 to 60%. The left ventricle has normal function. The left ventricle has no regional wall motion abnormalities. The left ventricular internal cavity size was normal in size. There is  moderate left ventricular hypertrophy. Left ventricular diastolic parameters are indeterminate. Right Ventricle: The right ventricular size is normal. No increase in right ventricular wall thickness. Right ventricular systolic function is normal. Tricuspid regurgitation signal is inadequate for assessing PA pressure. The tricuspid regurgitant velocity is 2.50 m/s, and with an assumed right atrial pressure of 10 mmHg, the estimated right ventricular systolic pressure is [id number] mmHg. Left Atrium: Left atrial size was normal in size. Right Atrium: Right atrial size was normal in size. Pericardium: There is no evidence of pericardial effusion. Mitral Valve: The mitral valve is normal in structure. Normal mobility of the mitral valve leaflets. Mild mitral valve regurgitation. No evidence of mitral valve stenosis. Tricuspid Valve: The tricuspid valve is normal in structure.  Tricuspid valve regurgitation is trivial. No evidence of tricuspid stenosis. Aortic Valve: The aortic valve is normal in structure. Aortic valve regurgitation is not visualized. No aortic stenosis is present. Aortic valve mean gradient measures 4.5 mmHg. Aortic valve peak gradient measures 8.1 mmHg. Aortic valve area, by VTI measures 2.47 cm. Pulmonic Valve: The pulmonic valve was normal in structure. Pulmonic valve regurgitation is not visualized. No evidence of pulmonic stenosis. Aorta: The aortic root is normal in size and structure. Venous: The inferior vena cava is dilated in size with greater than 50% respiratory variability, suggesting right atrial pressure of 8 mmHg. IAS/Shunts: No atrial level shunt detected by color flow Doppler.  LEFT VENTRICLE PLAX 2D LVIDd:         4.77 cm  Diastology LVIDs:  3.44 cm  LV e' lateral:   8.38 cm/s LV PW:         1.22 cm  LV E/e' lateral: 12.6 LV IVS:        1.14 cm  LV e' medial:    7.83 cm/s LVOT diam:     2.00 cm  LV E/e' medial:  13.5 LV SV:         79 LV SV Index:   36 LVOT Area:     3.14 cm  RIGHT VENTRICLE RV Basal diam:  3.60 cm RV S prime:     13.90 cm/s TAPSE (M-mode): 3.1 cm LEFT ATRIUM             Index       RIGHT ATRIUM           Index LA diam:        2.80 cm 1.25 cm/m  RA Area:     19.10 cm LA Vol (A2C):   89.3 ml 39.89 ml/m RA Volume:   60.80 ml  27.16 ml/m LA Vol (A4C):   36.0 ml 16.08 ml/m LA Biplane Vol: 57.5 ml 25.68 ml/m  AORTIC VALVE                   PULMONIC VALVE AV Area (Vmax):    2.63 cm    PV Vmax:        0.96 m/s AV Area (Vmean):   1.95 cm    PV Peak grad:   3.7 mmHg AV Area (VTI):     2.47 cm    RVOT Peak grad: 4 mmHg AV Vmax:           142.00 cm/s AV Vmean:          97.150 cm/s AV VTI:            0.322 m AV Peak Grad:      8.1 mmHg AV Mean Grad:      4.5 mmHg LVOT Vmax:         119.00 cm/s LVOT Vmean:        60.200 cm/s LVOT VTI:          0.253 m LVOT/AV VTI ratio: 0.79  AORTA Ao Root diam: 3.00 cm MITRAL VALVE                 TRICUSPID VALVE MV Area (PHT): 3.27 cm     TR Peak grad:   25.0 mmHg MV Decel Time: 232 msec     TR Vmax:        250.00 cm/s MV E velocity: 106.00 cm/s MV A velocity: 79.50 cm/s   SHUNTS MV E/A ratio:  1.33         Systemic VTI:  0.25 m                             Systemic Diam: 2.00 cm Kathlyn Sacramento MD Electronically signed by Kathlyn Sacramento MD Signature Date/Time: 09/12/2019/10:35:33 AM    Final      Labs:   Basic Metabolic Panel: Recent Labs  Lab 09/11/19 0544 09/12/19 0326  NA 141 138  K 3.4* 3.8  CL 100 102  CO2 29 29  GLUCOSE 169* 161*  BUN 15 31*  CREATININE 0.57 0.79  CALCIUM 9.1 9.2   GFR Estimated Creatinine Clearance: 90.9 mL/min (by C-G formula based on SCr of 0.79 mg/dL). Liver Function Tests: Recent Labs  Lab 09/11/19 0544  AST 24  ALT 30  ALKPHOS 76  BILITOT 0.5  PROT 8.0  ALBUMIN 4.0   No results for input(s): LIPASE, AMYLASE in the last 168 hours. No results for input(s): AMMONIA in the last 168 hours. Coagulation profile No results for input(s): INR, PROTIME in the last 168 hours.  CBC: Recent Labs  Lab 09/11/19 0458 09/11/19 1145 09/12/19 0326  WBC 12.5* 11.5* 12.0*  NEUTROABS 8.2*  --   --   HGB 14.4 14.3 12.7  HCT 43.6 43.9 38.3  MCV 85.7 86.8 84.9  PLT 300 294 284   Cardiac Enzymes: No results for input(s): CKTOTAL, CKMB, CKMBINDEX, TROPONINI in the last 168 hours. BNP: Invalid input(s): POCBNP CBG: Recent Labs  Lab 09/12/19 2337  GLUCAP 184*   D-Dimer No results for input(s): DDIMER in the last 72 hours. Hgb A1c No results for input(s): HGBA1C in the last 72 hours. Lipid Profile No results for input(s): CHOL, HDL, LDLCALC, TRIG, CHOLHDL, LDLDIRECT in the last 72 hours. Thyroid function studies Recent Labs    09/11/19 1145  TSH 0.145*  T4TOTAL 7.0   Anemia work up No results for input(s): VITAMINB12, FOLATE, FERRITIN, TIBC, IRON, RETICCTPCT in the last 72 hours. Microbiology Recent Results (from the past 240 hour(s))    Respiratory Panel by RT PCR (Flu A&B, Covid) - Nasopharyngeal Swab     Status: None   Collection Time: 09/11/19  8:10 AM   Specimen: Nasopharyngeal Swab  Result Value Ref Range Status   SARS Coronavirus 2 by RT PCR NEGATIVE NEGATIVE Final    Comment: (NOTE) SARS-CoV-2 target nucleic acids are NOT DETECTED. The SARS-CoV-2 RNA is generally detectable in upper respiratoy specimens during the acute phase of infection. The lowest concentration of SARS-CoV-2 viral copies this assay can detect is 131 copies/mL. A negative result does not preclude SARS-Cov-2 infection and should not be used as the sole basis for treatment or other patient management decisions. A negative result may occur with  improper specimen collection/handling, submission of specimen other than nasopharyngeal swab, presence of viral mutation(s) within the areas targeted by this assay, and inadequate number of viral copies (<131 copies/mL). A negative result must be combined with clinical observations, patient history, and epidemiological information. The expected result is Negative. Fact Sheet for Patients:  PinkCheek.be Fact Sheet for Healthcare Providers:  GravelBags.it This test is not yet ap proved or cleared by the [place] FDA and  has been authorized for detection and/or diagnosis of SARS-CoV-2 by FDA under an Emergency Use Authorization (EUA). This EUA will remain  in effect (meaning this test can be used) for the duration of the COVID-19 declaration under Section 564(b)(1) of the Act, 21 U.S.C. section 360bbb-3(b)(1), unless the authorization is terminated or revoked sooner.    Influenza A by PCR NEGATIVE NEGATIVE Final   Influenza B by PCR NEGATIVE NEGATIVE Final    Comment: (NOTE) The Xpert Xpress SARS-CoV-2/FLU/RSV assay is intended as an aid in  the diagnosis of influenza from Nasopharyngeal swab specimens and  should not be used as a sole  basis for treatment. Nasal washings and  aspirates are unacceptable for Xpert Xpress SARS-CoV-2/FLU/RSV  testing. Fact Sheet for Patients: PinkCheek.be Fact Sheet for Healthcare Providers: GravelBags.it This test is not yet approved or cleared by the [place] FDA and  has been authorized for detection and/or diagnosis of SARS-CoV-2 by  FDA under an Emergency Use Authorization (EUA). This EUA will remain  in effect (meaning this test can be used) for the  duration of the  Covid-19 declaration under Section 564(b)(1) of the Act, 21  U.S.C. section 360bbb-3(b)(1), unless the authorization is  terminated or revoked. Performed at [facility], [address] [place], [place] [address]      Discharge Instructions:   Discharge Instructions    Diet - low sodium heart healthy   Complete by: As directed    Diet Carb Modified   Complete by: As directed    Increase activity slowly   Complete by: As directed      Allergies as of 09/13/2019      Reactions   Penicillins Other (See Comments)   Patient passed out. Has patient had a PCN reaction causing immediate rash, facial/tongue/throat swelling, SOB or lightheadedness with hypotension: No Has patient had a PCN reaction causing severe rash involving mucus membranes or skin necrosis: No Has patient had a PCN reaction that required hospitalization No Has patient had a PCN reaction occurring within the last 10 years: No If all of the above answers are "NO", then may proceed with Cephalosporin use. Went to hospital   Cat Hair Extract    hives      Medication List    STOP taking these medications   amLODipine 10 MG tablet Commonly known as: NORVASC     TAKE these medications   albuterol 108 (90 Base) MCG/ACT inhaler Commonly known as: VENTOLIN HFA Inhale 1-2 puffs into the lungs every 4 (four) hours as needed for wheezing or shortness of breath.   amiodarone  200 MG tablet Commonly known as: PACERONE Take 1 tablet (200 mg total) by mouth daily.   atorvastatin 20 MG tablet Commonly known as: LIPITOR Take 20 mg by mouth daily.   furosemide 20 MG tablet Commonly known as: LASIX Take 20 mg by mouth daily.   losartan 25 MG tablet Commonly known as: COZAAR Take 25 mg by mouth daily.   meloxicam 15 MG tablet Commonly known as: MOBIC Take 15 mg by mouth daily.   metoprolol tartrate 25 MG tablet Commonly known as: LOPRESSOR Take 1 tablet (25 mg total) by mouth 2 (two) times daily.   Primatene Mist 0.125 MG/ACT Aero Generic drug: EPINEPHrine Inhale 1-2 puffs into the lungs 4 (four) times daily as needed (asthma symptoms).         Time coordinating discharge: 27 minutes  Signed:  Morrissa Shein  [facility] 09/13/2019, 12:51 PM

## 2019-11-23 ENCOUNTER — Other Ambulatory Visit: Payer: Self-pay | Admitting: Specialist

## 2019-11-23 DIAGNOSIS — J849 Interstitial pulmonary disease, unspecified: Secondary | ICD-10-CM

## 2020-01-25 ENCOUNTER — Other Ambulatory Visit: Payer: Self-pay | Admitting: Acute Care

## 2020-01-25 DIAGNOSIS — I63239 Cerebral infarction due to unspecified occlusion or stenosis of unspecified carotid arteries: Secondary | ICD-10-CM

## 2020-02-09 ENCOUNTER — Other Ambulatory Visit: Payer: Self-pay

## 2020-02-09 ENCOUNTER — Ambulatory Visit
Admission: RE | Admit: 2020-02-09 | Discharge: 2020-02-09 | Disposition: A | Discharge status: Home / Self Care | Payer: BC Managed Care – PPO | Source: Ambulatory Visit | Attending: Acute Care | Admitting: Acute Care

## 2020-02-09 DIAGNOSIS — I63239 Cerebral infarction due to unspecified occlusion or stenosis of unspecified carotid arteries: Secondary | ICD-10-CM | POA: Diagnosis not present

## 2020-06-15 ENCOUNTER — Ambulatory Visit (INDEPENDENT_AMBULATORY_CARE_PROVIDER_SITE_OTHER): Payer: Self-pay

## 2020-06-15 ENCOUNTER — Encounter (HOSPITAL_COMMUNITY): Payer: Self-pay | Admitting: Emergency Medicine

## 2020-06-15 ENCOUNTER — Ambulatory Visit (HOSPITAL_COMMUNITY)
Admission: EM | Admit: 2020-06-15 | Discharge: 2020-06-15 | Disposition: A | Discharge status: Home / Self Care | Payer: Self-pay

## 2020-06-15 ENCOUNTER — Other Ambulatory Visit: Payer: Self-pay

## 2020-06-15 DIAGNOSIS — J449 Chronic obstructive pulmonary disease, unspecified: Secondary | ICD-10-CM

## 2020-06-15 DIAGNOSIS — R059 Cough, unspecified: Secondary | ICD-10-CM

## 2020-06-15 DIAGNOSIS — R0602 Shortness of breath: Secondary | ICD-10-CM

## 2020-06-15 DIAGNOSIS — R042 Hemoptysis: Secondary | ICD-10-CM

## 2020-06-15 MED ORDER — PREDNISONE 10 MG (21) PO TBPK: ORAL_TABLET | Freq: Every day | ORAL | 0 refills | Status: DC

## 2020-06-15 MED ORDER — ALBUTEROL SULFATE HFA 108 (90 BASE) MCG/ACT IN AERS: 1.0000 | INHALATION_SPRAY | RESPIRATORY_TRACT | 1 refills | Status: DC | PRN

## 2020-06-15 NOTE — ED Provider Notes (Signed)
[place]    CSN: [id number] Arrival date & time: 06/15/20  1219      History   Chief Complaint Chief Complaint  Patient presents with  . Cough  . Shortness of Breath    HPI Beth Odonnell is a 69 y.o. female.   Pt is here due to she noticed cough and sob over the past week after seeing her grand children. She has copd and was using her inhaler with minimal relief. Does have thick clear mucous coughing at times. Has had covid vaccine and does not believe that is the cause. Is taking otc cough and cold medications with minimal relief.      Past Medical History:  Diagnosis Date  . Hypercholesteremia   . Hypertension   . Irregular heart beat     Patient Active Problem List   Diagnosis Date Noted  . Type 2 diabetes mellitus with hyperglycemia ([place]) 09/13/2019  . Essential hypertension 09/11/2019  . Acute respiratory failure with hypoxia ([place]) 09/11/2019  . Nonsustained ventricular tachycardia ([place]) 07/27/2016  . Chest pain 07/27/2016  . Hypokalemia 07/27/2016  . Near syncope 07/27/2016  . NSVT (nonsustained ventricular tachycardia) ([place]) 07/27/2016    Past Surgical History:  Procedure Laterality Date  . CARDIAC CATHETERIZATION N/A 07/28/2016   Procedure: Left Heart Cath and Coronary Angiography and possible PCI and stent;  Surgeon: Yolonda Kida, MD;  Location: [facility];  Service: Cardiovascular;  Laterality: N/A;  . COLONOSCOPY WITH PROPOFOL N/A 05/28/2018   Procedure: COLONOSCOPY WITH PROPOFOL;  Surgeon: Jonathon Bellows, MD;  Location[facility];  Service: Gastroenterology;  Laterality: N/A;    OB History   No obstetric history on file.      Home Medications    Prior to Admission medications   Medication Sig Start Date End Date Taking? Authorizing Provider  umeclidinium-vilanterol (ANORO ELLIPTA) 62.5-25 MCG/INH AEPB Inhale into the lungs. 10/04/19  Yes [provider]  albuterol (VENTOLIN HFA) 108 (90 Base) MCG/ACT  inhaler Inhale 1-2 puffs into the lungs every 4 (four) hours as needed for wheezing or shortness of breath. 06/15/20   Marney Setting, NP  amiodarone (PACERONE) 200 MG tablet Take 1 tablet (200 mg total) by mouth daily. 07/30/16   Vaughan Basta, MD  atorvastatin (LIPITOR) 20 MG tablet Take 20 mg by mouth daily. 08/22/19   [provider]  EPINEPHrine (PRIMATENE MIST) 0.125 MG/ACT AERO Inhale 1-2 puffs into the lungs 4 (four) times daily as needed (asthma symptoms).    [provider]  losartan (COZAAR) 25 MG tablet Take 25 mg by mouth daily. 08/22/19   [provider]  meloxicam (MOBIC) 15 MG tablet Take 15 mg by mouth daily. 08/22/19   [provider]  metoprolol tartrate (LOPRESSOR) 25 MG tablet Take 1 tablet (25 mg total) by mouth 2 (two) times daily. 07/29/16   Vaughan Basta, MD  predniSONE (STERAPRED UNI-PAK 21 TAB) 10 MG (21) TBPK tablet Take by mouth daily. Take 6 tabs by mouth daily  for 2 days, then 5 tabs for 2 days, then 4 tabs for 2 days, then 3 tabs for 2 days, 2 tabs for 2 days, then 1 tab by mouth daily for 2 days 06/15/20   Marney Setting, NP    Family History History reviewed. No pertinent family history.  Social History Social History   Tobacco Use  . Smoking status: Current Every Day Smoker  . Smokeless tobacco: Never Used  Substance Use Topics  . Alcohol use:  No     Allergies   Penicillins and Cat hair extract   Review of Systems Review of Systems  Constitutional: Positive for activity change.  HENT: Negative for congestion, postnasal drip, sinus pressure, sinus pain and sore throat.   Eyes: Negative.   Respiratory: Positive for cough, shortness of breath and wheezing.   Cardiovascular: Negative.   Gastrointestinal: Negative.   Genitourinary: Negative.   Neurological: Negative.      Physical Exam Triage Vital Signs ED Triage Vitals  Enc Vitals Group     BP 06/15/20 1350 (!) 155/83     Pulse  Rate 06/15/20 1350 90     Resp 06/15/20 1350 17     Temp 06/15/20 1350 97.8 F (36.6 C)     Temp Source 06/15/20 1350 Oral     SpO2 06/15/20 1350 95 %     Weight --      Height --      Head Circumference --      Peak Flow --      Pain Score 06/15/20 1348 0     Pain Loc --      Pain Edu? --      Excl. in [place]? --    No data found.  Updated Vital Signs BP (!) 155/83 (BP Location: Right Arm)   Pulse 90   Temp 97.8 F (36.6 C) (Oral)   Resp 17   SpO2 95%   Visual Acuity     Physical Exam Constitutional:      Appearance: She is well-developed.  Cardiovascular:     Rate and Rhythm: Normal rate.  Pulmonary:     Breath sounds: Examination of the right-upper field reveals wheezing. Examination of the left-upper field reveals wheezing. Examination of the right-middle field reveals wheezing. Examination of the left-middle field reveals wheezing. Wheezing and rhonchi present.     Comments: Ox 95 ra ,  Abdominal:     Palpations: Abdomen is soft.  Skin:    General: Skin is warm.  Neurological:     Mental Status: She is alert.      UC Treatments / Results  Labs (all labs ordered are listed, but only abnormal results are displayed) Labs Reviewed - No data to display  EKG   Radiology DG Chest 2 View  Result Date: 06/15/2020 CLINICAL DATA:  Cough.  Wheezing.  Shortness of breath.  COPD. EXAM: CHEST - 2 VIEW COMPARISON:  09/11/2019 FINDINGS: Mild hyperinflation. Midline trachea. Normal heart size. Tortuous thoracic aorta. Costophrenic angles excluded from both images. No large pleural effusion. No pneumothorax. Clear lungs. IMPRESSION: Hyperinflation, without acute disease. Electronically Signed   By: Abigail Miyamoto M.D.   On: 06/15/2020 14:54    Procedures Procedures (including critical care time)  Medications Ordered in UC Medications - No data to display  Initial Impression / Assessment and Plan / UC Course  I have reviewed the triage vital signs and the nursing  notes.  Pertinent labs & imaging results that were available during my care of the patient were reviewed by me and considered in my medical decision making (see chart for details).     Will need to use inhalers prn  Needs to follow up with pcp for medication refills and follow up  If symptoms become worse will need to go to the ER  Not able to r/o covid with out test   Final Clinical Impressions(s) / UC Diagnoses   Final diagnoses:  Cough  Shortness of breath  Discharge Instructions     Use inhaler as needed  Take the steriod      ED Prescriptions    Medication Sig Dispense Auth. Provider   albuterol (VENTOLIN HFA) 108 (90 Base) MCG/ACT inhaler Inhale 1-2 puffs into the lungs every 4 (four) hours as needed for wheezing or shortness of breath. 90 g Morley Kos L, NP   predniSONE (STERAPRED UNI-PAK 21 TAB) 10 MG (21) TBPK tablet Take by mouth daily. Take 6 tabs by mouth daily  for 2 days, then 5 tabs for 2 days, then 4 tabs for 2 days, then 3 tabs for 2 days, 2 tabs for 2 days, then 1 tab by mouth daily for 2 days 42 tablet Marney Setting, NP     PDMP not reviewed this encounter.   Marney Setting, NP 06/15/20 1459

## 2020-06-15 NOTE — Discharge Instructions (Signed)
Use inhaler as needed  Take the steriod

## 2020-06-15 NOTE — ED Triage Notes (Signed)
Pt presents with coughing and SOB. States started with nasal congestion and cough around thanksgiving after being around sick grandchild.  States needs refills on both inhalers.

## 2020-09-24 ENCOUNTER — Other Ambulatory Visit: Payer: Self-pay

## 2020-09-24 ENCOUNTER — Emergency Department: Payer: Medicare Other

## 2020-09-24 ENCOUNTER — Inpatient Hospital Stay
Admission: EM | Admit: 2020-09-24 | Discharge: 2020-09-27 | DRG: 190 | Disposition: A | Discharge status: Home / Self Care | Payer: Medicare Other | Attending: Internal Medicine | Admitting: Internal Medicine

## 2020-09-24 DIAGNOSIS — Z7984 Long term (current) use of oral hypoglycemic drugs: Secondary | ICD-10-CM

## 2020-09-24 DIAGNOSIS — Z91048 Other nonmedicinal substance allergy status: Secondary | ICD-10-CM

## 2020-09-24 DIAGNOSIS — E1165 Type 2 diabetes mellitus with hyperglycemia: Secondary | ICD-10-CM | POA: Diagnosis present

## 2020-09-24 DIAGNOSIS — J441 Chronic obstructive pulmonary disease with (acute) exacerbation: Secondary | ICD-10-CM | POA: Diagnosis not present

## 2020-09-24 DIAGNOSIS — I2510 Atherosclerotic heart disease of native coronary artery without angina pectoris: Secondary | ICD-10-CM | POA: Diagnosis present

## 2020-09-24 DIAGNOSIS — Z20822 Contact with and (suspected) exposure to covid-19: Secondary | ICD-10-CM | POA: Diagnosis present

## 2020-09-24 DIAGNOSIS — I480 Paroxysmal atrial fibrillation: Secondary | ICD-10-CM | POA: Diagnosis present

## 2020-09-24 DIAGNOSIS — Z7982 Long term (current) use of aspirin: Secondary | ICD-10-CM

## 2020-09-24 DIAGNOSIS — E119 Type 2 diabetes mellitus without complications: Secondary | ICD-10-CM

## 2020-09-24 DIAGNOSIS — R0902 Hypoxemia: Secondary | ICD-10-CM

## 2020-09-24 DIAGNOSIS — E876 Hypokalemia: Secondary | ICD-10-CM | POA: Diagnosis not present

## 2020-09-24 DIAGNOSIS — I10 Essential (primary) hypertension: Secondary | ICD-10-CM | POA: Diagnosis present

## 2020-09-24 DIAGNOSIS — J9601 Acute respiratory failure with hypoxia: Secondary | ICD-10-CM | POA: Diagnosis present

## 2020-09-24 DIAGNOSIS — Z79899 Other long term (current) drug therapy: Secondary | ICD-10-CM

## 2020-09-24 DIAGNOSIS — E785 Hyperlipidemia, unspecified: Secondary | ICD-10-CM | POA: Diagnosis present

## 2020-09-24 DIAGNOSIS — Z880 Allergy status to penicillin: Secondary | ICD-10-CM

## 2020-09-24 DIAGNOSIS — F17200 Nicotine dependence, unspecified, uncomplicated: Secondary | ICD-10-CM | POA: Diagnosis present

## 2020-09-24 NOTE — ED Provider Notes (Signed)
[facility] Emergency Department Provider Note   ____________________________________________   Event Date/Time   First MD Initiated Contact with Patient 09/24/20 2341     (approximate)  I have reviewed the triage vital signs and the nursing notes.   HISTORY  Chief Complaint Shortness of Breath    HPI Beth Odonnell is a 70 y.o. female brought to the ED via EMS from home with a chief complaint of respiratory distress.  Patient has a history of COPD, CHF not on home oxygen.  Reports shortness of breath this evening.  EMS reports room air saturations mid 80%'s.  Given 1 albuterol nebulizer, 1 DuoNeb, 125 mg IV Solu-Medrol and placed on [place] oxygen en route to the ED.  Patient endorses chest tightness, cough, wheezing and shortness of breath.  Went to urgent care this afternoon and had not had a chance to fill her Z-Pak.  Denies fever, chills, abdominal pain, nausea, vomiting or diarrhea.      Past Medical History:  Diagnosis Date  . Hypercholesteremia   . Hypertension   . Irregular heart beat     Patient Active Problem List   Diagnosis Date Noted  . Type 2 diabetes mellitus with hyperglycemia ([place]) 09/13/2019  . Essential hypertension 09/11/2019  . Acute respiratory failure with hypoxia ([place]) 09/11/2019  . Nonsustained ventricular tachycardia ([place]) 07/27/2016  . Chest pain 07/27/2016  . Hypokalemia 07/27/2016  . Near syncope 07/27/2016  . NSVT (nonsustained ventricular tachycardia) ([place]) 07/27/2016    Past Surgical History:  Procedure Laterality Date  . CARDIAC CATHETERIZATION N/A 07/28/2016   Procedure: Left Heart Cath and Coronary Angiography and possible PCI and stent;  Surgeon: Yolonda Kida, MD;  Location: [facility];  Service: Cardiovascular;  Laterality: N/A;  . COLONOSCOPY WITH PROPOFOL N/A 05/28/2018   Procedure: COLONOSCOPY WITH PROPOFOL;  Surgeon: Jonathon Bellows, MD;  Location: [facility] ENDOSCOPY;  Service: Gastroenterology;   Laterality: N/A;    Prior to Admission medications   Medication Sig Start Date End Date Taking? Authorizing Provider  albuterol (VENTOLIN HFA) 108 (90 Base) MCG/ACT inhaler Inhale 1-2 puffs into the lungs every 4 (four) hours as needed for wheezing or shortness of breath. 06/15/20   Marney Setting, NP  amiodarone (PACERONE) 200 MG tablet Take 1 tablet (200 mg total) by mouth daily. 07/30/16   Vaughan Basta, MD  atorvastatin (LIPITOR) 20 MG tablet Take 20 mg by mouth daily. 08/22/19   [provider]  EPINEPHrine (PRIMATENE MIST) 0.125 MG/ACT AERO Inhale 1-2 puffs into the lungs 4 (four) times daily as needed (asthma symptoms).    [provider]  losartan (COZAAR) 25 MG tablet Take 25 mg by mouth daily. 08/22/19   [provider]  meloxicam (MOBIC) 15 MG tablet Take 15 mg by mouth daily. 08/22/19   [provider]  metoprolol tartrate (LOPRESSOR) 25 MG tablet Take 1 tablet (25 mg total) by mouth 2 (two) times daily. 07/29/16   Vaughan Basta, MD  predniSONE (STERAPRED UNI-PAK 21 TAB) 10 MG (21) TBPK tablet Take by mouth daily. Take 6 tabs by mouth daily  for 2 days, then 5 tabs for 2 days, then 4 tabs for 2 days, then 3 tabs for 2 days, 2 tabs for 2 days, then 1 tab by mouth daily for 2 days 06/15/20   Marney Setting, NP  umeclidinium-vilanterol (ANORO ELLIPTA) 62.5-25 MCG/INH AEPB Inhale into the lungs. 10/04/19   [provider]    Allergies Penicillins and Cat hair extract  History reviewed. No pertinent family history.  Social History Social History   Tobacco Use  . Smoking status: Current Every Day Smoker  . Smokeless tobacco: Never Used  Substance Use Topics  . Alcohol use: No    Review of Systems  Constitutional: No fever/chills Eyes: No visual changes. ENT: No sore throat. Cardiovascular: Denies chest pain. Respiratory: Positive for cough, wheezing and shortness of breath. Gastrointestinal: No abdominal  pain.  No nausea, no vomiting.  No diarrhea.  No constipation. Genitourinary: Negative for dysuria. Musculoskeletal: Negative for back pain. Skin: Negative for rash. Neurological: Negative for headaches, focal weakness or numbness.   ____________________________________________   PHYSICAL EXAM:  VITAL SIGNS: ED Triage Vitals [09/24/20 2340]  Enc Vitals Group     BP      Pulse Rate (!) 103     Resp 19     Temp      Temp src      SpO2 100 %     Weight      Height      Head Circumference      Peak Flow      Pain Score      Pain Loc      Pain Edu?      Excl. in [place]?     Constitutional: Alert and oriented. Well appearing and in moderate acute distress. Eyes: Conjunctivae are normal. PERRL. EOMI. Head: Atraumatic. Nose: No congestion/rhinnorhea. Mouth/Throat: Mucous membranes are moist.   Neck: No stridor.   Cardiovascular: Tachycardic rate, regular rhythm. Grossly normal heart sounds.  Good peripheral circulation. Respiratory: Increased respiratory effort.  Retractions. Lungs diminished with wheezing. Gastrointestinal: Soft and nontender. No distention. No abdominal bruits. No CVA tenderness. Musculoskeletal: No lower extremity tenderness nor edema.  No joint effusions. Neurologic:  Normal speech and language. No gross focal neurologic deficits are appreciated.  Skin:  Skin is warm, dry and intact. No rash noted. Psychiatric: Mood and affect are normal. Speech and behavior are normal.  ____________________________________________   LABS (all labs ordered are listed, but only abnormal results are displayed)  Labs Reviewed  COMPREHENSIVE METABOLIC PANEL - Abnormal; Notable for the following components:      Result Value   Potassium 3.2 (*)    Glucose, Bld 188 (*)    All other components within normal limits  RESP PANEL BY RT-PCR (FLU A&B, COVID) ARPGX2  CBC WITH DIFFERENTIAL/PLATELET  BRAIN NATRIURETIC PEPTIDE  TROPONIN I (HIGH SENSITIVITY)  TROPONIN I (HIGH  SENSITIVITY)   ____________________________________________  EKG  ED ECG REPORT I, Dewitt Judice J, the attending physician, personally viewed and interpreted this ECG.   Date: 09/24/2020  EKG Time: 2344  Rate: 95  Rhythm: normal EKG, normal sinus rhythm  Axis: LAD  Intervals:none  ST&T Change: Nonspecific  ____________________________________________  RADIOLOGY I, Ryen Rhames J, personally viewed and evaluated these images (plain radiographs) as part of my medical decision making, as well as reviewing the written report by the radiologist.  ED MD interpretation: No acute cardiopulmonary process  Official radiology report(s): DG Chest Port 1 View  Result Date: 09/25/2020 CLINICAL DATA:  70 year old female with shortness of breath. EXAM: PORTABLE CHEST 1 VIEW COMPARISON:  Chest radiograph dated 06/15/2020. FINDINGS: Mild interstitial prominence, likely chronic. No focal consolidation, pleural effusion or pneumothorax. The cardiac silhouette is within limits. No acute osseous pathology. IMPRESSION: No active disease. Electronically Signed   By: Anner Crete M.D.   On: 09/25/2020 00:39    ____________________________________________   PROCEDURES  Procedure(s) performed (including Critical Care):  Marland Kitchen  1-3 Lead EKG Interpretation Performed by: Paulette Blanch, MD Authorized by: Paulette Blanch, MD     Interpretation: abnormal     ECG rate:  103   ECG rate assessment: tachycardic     Rhythm: sinus tachycardia     Ectopy: none     Conduction: normal   Comments:     Patient placed on cardiac monitor to evaluate for arrhythmias     ____________________________________________   INITIAL IMPRESSION / ASSESSMENT AND PLAN / ED COURSE  As part of my medical decision making, I reviewed the following data within the Echo notes reviewed and incorporated, Labs reviewed, EKG interpreted, Old chart reviewed, Radiograph reviewed and Notes from prior ED  visits     70 year old female presenting with respiratory distress Differential includes, but is not limited to, viral syndrome, bronchitis including COPD exacerbation, pneumonia, reactive airway disease including asthma, CHF including exacerbation with or without pulmonary/interstitial edema, pneumothorax, ACS, thoracic trauma, and pulmonary embolism.  Patient finishing DuoNeb by EMS currently.  Will obtain basic lab work, chest x-ray, COVID-19 swab (patient is vaccinated but not yet boosted).  Anticipate hospitalization given patient's hypoxia.  Clinical Course as of 09/25/20 0118  Tue Sep 25, 2020  0117 Patient with loose cough noted.  Currently on 2 L nasal cannula oxygen with saturations 93%.  Slightly improved but chest still sounds tight.  Will administer another DuoNeb.  Will discuss with hospitalist services for admission. [JS]    Clinical Course User Index [JS] Paulette Blanch, MD     ____________________________________________   FINAL CLINICAL IMPRESSION(S) / ED DIAGNOSES  Final diagnoses:  COPD exacerbation ([place])  Hypoxia  Hypokalemia     ED Discharge Orders    None      *Please note:  Keshara Kiger was evaluated in Emergency Department on 09/25/2020 for the symptoms described in the history of present illness. She was evaluated in the context of the global COVID-19 pandemic, which necessitated consideration that the patient might be at risk for infection with the SARS-CoV-2 virus that causes COVID-19. Institutional protocols and algorithms that pertain to the evaluation of patients at risk for COVID-19 are in a state of rapid change based on information released by regulatory bodies including the CDC and federal and state organizations. These policies and algorithms were followed during the patient's care in the ED.  Some ED evaluations and interventions may be delayed as a result of limited staffing during and the pandemic.*   Note:  This document was prepared using  Dragon voice recognition software and may include unintentional dictation errors.   Paulette Blanch, MD 09/25/20 [phone]

## 2020-09-25 ENCOUNTER — Emergency Department: Payer: Medicare Other

## 2020-09-25 DIAGNOSIS — F17200 Nicotine dependence, unspecified, uncomplicated: Secondary | ICD-10-CM | POA: Diagnosis present

## 2020-09-25 DIAGNOSIS — E1165 Type 2 diabetes mellitus with hyperglycemia: Secondary | ICD-10-CM | POA: Diagnosis present

## 2020-09-25 DIAGNOSIS — J9601 Acute respiratory failure with hypoxia: Secondary | ICD-10-CM

## 2020-09-25 DIAGNOSIS — E876 Hypokalemia: Secondary | ICD-10-CM | POA: Diagnosis present

## 2020-09-25 DIAGNOSIS — J441 Chronic obstructive pulmonary disease with (acute) exacerbation: Secondary | ICD-10-CM | POA: Diagnosis present

## 2020-09-25 DIAGNOSIS — E785 Hyperlipidemia, unspecified: Secondary | ICD-10-CM | POA: Diagnosis present

## 2020-09-25 DIAGNOSIS — I10 Essential (primary) hypertension: Secondary | ICD-10-CM | POA: Diagnosis present

## 2020-09-25 DIAGNOSIS — Z20822 Contact with and (suspected) exposure to covid-19: Secondary | ICD-10-CM | POA: Diagnosis present

## 2020-09-25 DIAGNOSIS — Z79899 Other long term (current) drug therapy: Secondary | ICD-10-CM | POA: Diagnosis not present

## 2020-09-25 DIAGNOSIS — Z7982 Long term (current) use of aspirin: Secondary | ICD-10-CM | POA: Diagnosis not present

## 2020-09-25 DIAGNOSIS — I480 Paroxysmal atrial fibrillation: Secondary | ICD-10-CM | POA: Diagnosis present

## 2020-09-25 DIAGNOSIS — Z91048 Other nonmedicinal substance allergy status: Secondary | ICD-10-CM | POA: Diagnosis not present

## 2020-09-25 DIAGNOSIS — Z880 Allergy status to penicillin: Secondary | ICD-10-CM | POA: Diagnosis not present

## 2020-09-25 DIAGNOSIS — I2510 Atherosclerotic heart disease of native coronary artery without angina pectoris: Secondary | ICD-10-CM | POA: Diagnosis present

## 2020-09-25 DIAGNOSIS — Z7984 Long term (current) use of oral hypoglycemic drugs: Secondary | ICD-10-CM | POA: Diagnosis not present

## 2020-09-25 DIAGNOSIS — E119 Type 2 diabetes mellitus without complications: Secondary | ICD-10-CM

## 2020-09-25 LAB — CBC WITH DIFFERENTIAL/PLATELET
Abs Immature Granulocytes: 0.01 10*3/uL (ref 0.00–0.07)
Basophils Absolute: 0.1 10*3/uL (ref 0.0–0.1)
Basophils Relative: 1 %
Eosinophils Absolute: 0.5 10*3/uL (ref 0.0–0.5)
Eosinophils Relative: 7 %
HCT: 43.5 % (ref 36.0–46.0)
Hemoglobin: 13.9 g/dL (ref 12.0–15.0)
Immature Granulocytes: 0 %
Lymphocytes Relative: 38 %
Lymphs Abs: 2.7 10*3/uL (ref 0.7–4.0)
MCH: 28 pg (ref 26.0–34.0)
MCHC: 32 g/dL (ref 30.0–36.0)
MCV: 87.7 fL (ref 80.0–100.0)
Monocytes Absolute: 0.6 10*3/uL (ref 0.1–1.0)
Monocytes Relative: 9 %
Neutro Abs: 3.3 10*3/uL (ref 1.7–7.7)
Neutrophils Relative %: 45 %
Platelets: 286 10*3/uL (ref 150–400)
RBC: 4.96 MIL/uL (ref 3.87–5.11)
RDW: 14.3 % (ref 11.5–15.5)
WBC: 7.2 10*3/uL (ref 4.0–10.5)
nRBC: 0 % (ref 0.0–0.2)

## 2020-09-25 LAB — COMPREHENSIVE METABOLIC PANEL
ALT: 26 U/L (ref 0–44)
AST: 26 U/L (ref 15–41)
Albumin: 4.3 g/dL (ref 3.5–5.0)
Alkaline Phosphatase: 85 U/L (ref 38–126)
Anion gap: 9 (ref 5–15)
BUN: 14 mg/dL (ref 8–23)
CO2: 26 mmol/L (ref 22–32)
Calcium: 8.9 mg/dL (ref 8.9–10.3)
Chloride: 104 mmol/L (ref 98–111)
Creatinine, Ser: 0.64 mg/dL (ref 0.44–1.00)
GFR, Estimated: >60 mL/min (ref >60)
Glucose, Bld: 188 mg/dL — ABNORMAL HIGH (ref 70–99)
Potassium: 3.2 mmol/L — ABNORMAL LOW (ref 3.5–5.1)
Sodium: 139 mmol/L (ref 135–145)
Total Bilirubin: 0.6 mg/dL (ref 0.3–1.2)
Total Protein: 8.1 g/dL (ref 6.5–8.1)

## 2020-09-25 LAB — TROPONIN I (HIGH SENSITIVITY)
Troponin I (High Sensitivity): 5 ng/L (ref <18)
Troponin I (High Sensitivity): 6 ng/L (ref <18)

## 2020-09-25 LAB — HEMOGLOBIN A1C
Hgb A1c MFr Bld: 8.8 % — ABNORMAL HIGH (ref 4.8–5.6)
Mean Plasma Glucose: 205.86 mg/dL

## 2020-09-25 LAB — GLUCOSE, CAPILLARY
Glucose-Capillary: 293 mg/dL — ABNORMAL HIGH (ref 70–99)
Glucose-Capillary: 249 mg/dL — ABNORMAL HIGH (ref 70–99)
Glucose-Capillary: 230 mg/dL — ABNORMAL HIGH (ref 70–99)

## 2020-09-25 LAB — BRAIN NATRIURETIC PEPTIDE: B Natriuretic Peptide: 10.3 pg/mL (ref 0.0–100.0)

## 2020-09-25 LAB — RESP PANEL BY RT-PCR (FLU A&B, COVID) ARPGX2
Influenza A by PCR: NEGATIVE
Influenza B by PCR: NEGATIVE
SARS Coronavirus 2 by RT PCR: NEGATIVE

## 2020-09-25 LAB — CBG MONITORING, ED
Glucose-Capillary: 230 mg/dL — ABNORMAL HIGH (ref 70–99)
Glucose-Capillary: 233 mg/dL — ABNORMAL HIGH (ref 70–99)

## 2020-09-25 LAB — HIV ANTIBODY (ROUTINE TESTING W REFLEX): HIV Screen 4th Generation wRfx: NONREACTIVE

## 2020-09-25 MED ORDER — IPRATROPIUM-ALBUTEROL 0.5-2.5 (3) MG/3ML IN SOLN
3.0000 mL | Freq: Once | RESPIRATORY_TRACT | Status: AC
  Administered 2020-09-25: 3 mL via RESPIRATORY_TRACT
  Filled 2020-09-25: qty 3

## 2020-09-25 MED ORDER — HYDROCOD POLST-CPM POLST ER 10-8 MG/5ML PO SUER
5.0000 mL | Freq: Once | ORAL | Status: AC
  Administered 2020-09-25: 5 mL via ORAL
  Filled 2020-09-25: qty 5

## 2020-09-25 MED ORDER — IPRATROPIUM-ALBUTEROL 0.5-2.5 (3) MG/3ML IN SOLN
3.0000 mL | Freq: Four times a day (QID) | RESPIRATORY_TRACT | Status: DC
  Administered 2020-09-25 – 2020-09-26 (×5): 3 mL via RESPIRATORY_TRACT
  Filled 2020-09-25 (×5): qty 3

## 2020-09-25 MED ORDER — INSULIN ASPART 100 UNIT/ML ~~LOC~~ SOLN
0.0000 [IU] | Freq: Three times a day (TID) | SUBCUTANEOUS | Status: DC
  Administered 2020-09-25: 11 [IU] via SUBCUTANEOUS
  Administered 2020-09-25 (×2): 7 [IU] via SUBCUTANEOUS
  Administered 2020-09-26: 11 [IU] via SUBCUTANEOUS
  Administered 2020-09-26: 17:00:00 7 [IU] via SUBCUTANEOUS
  Administered 2020-09-26: 4 [IU] via SUBCUTANEOUS
  Administered 2020-09-27: 7 [IU] via SUBCUTANEOUS
  Administered 2020-09-27: 3 [IU] via SUBCUTANEOUS
  Filled 2020-09-25 (×8): qty 1

## 2020-09-25 MED ORDER — ACETAMINOPHEN 325 MG PO TABS
650.0000 mg | ORAL_TABLET | ORAL | Status: DC | PRN
  Administered 2020-09-25 – 2020-09-27 (×4): 650 mg via ORAL
  Filled 2020-09-25 (×4): qty 2

## 2020-09-25 MED ORDER — GUAIFENESIN-CODEINE 100-10 MG/5ML PO SOLN
10.0000 mL | ORAL | Status: DC | PRN
  Administered 2020-09-25 – 2020-09-27 (×3): 10 mL via ORAL
  Filled 2020-09-25 (×3): qty 10

## 2020-09-25 MED ORDER — METHYLPREDNISOLONE SODIUM SUCC 40 MG IJ SOLR
40.0000 mg | Freq: Four times a day (QID) | INTRAMUSCULAR | Status: AC
  Administered 2020-09-25 (×4): 40 mg via INTRAVENOUS
  Filled 2020-09-25 (×4): qty 1

## 2020-09-25 MED ORDER — ALBUTEROL SULFATE (2.5 MG/3ML) 0.083% IN NEBU
2.5000 mg | INHALATION_SOLUTION | RESPIRATORY_TRACT | Status: DC | PRN
  Administered 2020-09-25: 2.5 mg via RESPIRATORY_TRACT
  Filled 2020-09-25 (×2): qty 3

## 2020-09-25 MED ORDER — PREDNISONE 20 MG PO TABS
40.0000 mg | ORAL_TABLET | Freq: Every day | ORAL | Status: DC
  Administered 2020-09-26 – 2020-09-27 (×2): 40 mg via ORAL
  Filled 2020-09-25 (×2): qty 2

## 2020-09-25 MED ORDER — POTASSIUM CHLORIDE CRYS ER 20 MEQ PO TBCR
40.0000 meq | EXTENDED_RELEASE_TABLET | Freq: Once | ORAL | Status: AC
  Administered 2020-09-25: 40 meq via ORAL
  Filled 2020-09-25: qty 2

## 2020-09-25 MED ORDER — INSULIN ASPART 100 UNIT/ML ~~LOC~~ SOLN
0.0000 [IU] | Freq: Every day | SUBCUTANEOUS | Status: DC
  Administered 2020-09-25 (×2): 2 [IU] via SUBCUTANEOUS
  Filled 2020-09-25 (×2): qty 1

## 2020-09-25 MED ORDER — ENOXAPARIN SODIUM 60 MG/0.6ML ~~LOC~~ SOLN
0.5000 mg/kg | SUBCUTANEOUS | Status: DC
  Administered 2020-09-25 – 2020-09-27 (×3): 52.5 mg via SUBCUTANEOUS
  Filled 2020-09-25 (×3): qty 0.6

## 2020-09-25 NOTE — ED Notes (Addendum)
Pt hypoxic on RA while at rest. [place] applied. Wheezing and SOB improved after EMS breathing tx.

## 2020-09-25 NOTE — ED Notes (Signed)
Report received from Mary RN

## 2020-09-25 NOTE — Progress Notes (Signed)
PHARMACIST - PHYSICIAN COMMUNICATION  CONCERNING:  Enoxaparin (Lovenox) for DVT Prophylaxis    RECOMMENDATION: Patient was prescribed enoxaprin 40mg  q24 hours for VTE prophylaxis.   Filed Weights   09/24/20 2345  Weight: 105.2 kg (232 lb)    Body mass index is 32.36 kg/m.  Estimated Creatinine Clearance: 89.9 mL/min (by C-G formula based on SCr of 0.64 mg/dL).   Based on [place] patient is candidate for enoxaparin 0.5mg /kg TBW SQ every 24 hours based on BMI being >30.   DESCRIPTION: Pharmacy has adjusted enoxaparin dose per [facility] policy.  Patient is now receiving enoxaparin 0.5 mg/kg every 24 hours   Renda Rolls, PharmD, [facility] 09/25/2020 1:54 AM

## 2020-09-25 NOTE — ED Triage Notes (Signed)
Pt presents via EMS from home for c/o cough and SOB x 2 days. Per report, pt went to urgent care this morning and was prescribed abx and given a breathing tx while she was there. Pt sts she was feeling better after that.  Then tonight while shopping, pt sts she became very SOB. Pt was hypoxic in the 80s on RA with EMS. EMS gave 1 albuterol tx, 1 duoneb, 125mg  solumedrol PTA. Breathing tx in progress on arrival. O2 sat 99% on neb. Dr Beather Arbour at bedside on arrival.

## 2020-09-25 NOTE — Progress Notes (Signed)
PT Cancellation Note  Patient Details Name: Beth Odonnell MRN: [id number] DOB: 10/20/1950   Cancelled Treatment:    Reason Eval/Treat Not Completed: Patient at procedure or test/unavailable.  Pt currently getting breathing treatment.  Will attempt to see at later time/date as medically appropriate.    Gwenlyn Saran, PT, DPT 09/25/20, 12:50 PM

## 2020-09-25 NOTE — H&P (Signed)
History and Physical    [name]:[id number] DOB: 1950/08/15 DOA: 09/24/2020  PCP: Donnie Coffin, MD   Patient coming from: home   I have personally briefly reviewed patient's old medical records in [place]  Chief Complaint: Shortness of breath  HPI: Beth Odonnell is a 70 y.o. female with medical history significant for Nonobstructive CAD, COPD, HTN, DM who presents to the emergency room by EMS for increased work of breathing.  Was seen earlier in the urgent care for cough and shortness of breath x2 days and was treated with DuoNeb and felt better however Later that evening while shopping she became very dyspneic.  She denied chest pain, fever or chills, pain or swelling in the legs.  EMS reported O2 sat in the 80s.  She received a DuoNeb, Solu-Medrol and route and placed on oxygen ED course: On arrival, afebrile, BP 176/110, pulse 126 with O2 sat 87% on room air improving to the low 90s on 2 L.  CBC WNL, troponin was 5, BNP 10, CMP significant only for potassium of 3.2.  COVID and flu test negative EKG as interpreted by me: Sinus at 95 with nonspecific ST-T wave changes Imaging: Chest x-ray with no active disease  Patient given an additional nebulizer in the emergency room.  Hospitalist consulted for admission.   Review of Systems: As per HPI otherwise all other systems on review of systems negative.    Past Medical History:  Diagnosis Date  . Hypercholesteremia   . Hypertension   . Irregular heart beat     Past Surgical History:  Procedure Laterality Date  . CARDIAC CATHETERIZATION N/A 07/28/2016   Procedure: Left Heart Cath and Coronary Angiography and possible PCI and stent;  Surgeon: Yolonda Kida, MD;  Location: [facility] CV LAB;  Service: Cardiovascular;  Laterality: N/A;  . COLONOSCOPY WITH PROPOFOL N/A 05/28/2018   Procedure: COLONOSCOPY WITH PROPOFOL;  Surgeon: Jonathon Bellows, MD;  Location: [facility] ENDOSCOPY;  Service: Gastroenterology;   Laterality: N/A;     reports that she has been smoking. She has never used smokeless tobacco. She reports that she does not drink alcohol. No history on file for drug use.  Allergies  Allergen Reactions  . Penicillins Other (See Comments)    Patient passed out.  Has patient had a PCN reaction causing immediate rash, facial/tongue/throat swelling, SOB or lightheadedness with hypotension: No Has patient had a PCN reaction causing severe rash involving mucus membranes or skin necrosis: No Has patient had a PCN reaction that required hospitalization No Has patient had a PCN reaction occurring within the last 10 years: No If all of the above answers are "NO", then may proceed with Cephalosporin use. Went to hospital  . Cat Hair Extract     hives    History reviewed. No pertinent family history.    Prior to Admission medications   Medication Sig Start Date End Date Taking? Authorizing Provider  albuterol (VENTOLIN HFA) 108 (90 Base) MCG/ACT inhaler Inhale 1-2 puffs into the lungs every 4 (four) hours as needed for wheezing or shortness of breath. 06/15/20   Marney Setting, NP  amiodarone (PACERONE) 200 MG tablet Take 1 tablet (200 mg total) by mouth daily. 07/30/16   Vaughan Basta, MD  atorvastatin (LIPITOR) 20 MG tablet Take 20 mg by mouth daily. 08/22/19   [provider]  EPINEPHrine (PRIMATENE MIST) 0.125 MG/ACT AERO Inhale 1-2 puffs into the lungs 4 (four) times daily as needed (asthma symptoms).    [provider]  losartan (COZAAR) 25 MG tablet Take 25 mg by mouth daily. 08/22/19   [provider]  meloxicam (MOBIC) 15 MG tablet Take 15 mg by mouth daily. 08/22/19   [provider]  metoprolol tartrate (LOPRESSOR) 25 MG tablet Take 1 tablet (25 mg total) by mouth 2 (two) times daily. 07/29/16   Vaughan Basta, MD  predniSONE (STERAPRED UNI-PAK 21 TAB) 10 MG (21) TBPK tablet Take by mouth daily. Take 6 tabs by mouth daily  for 2  days, then 5 tabs for 2 days, then 4 tabs for 2 days, then 3 tabs for 2 days, 2 tabs for 2 days, then 1 tab by mouth daily for 2 days 06/15/20   Marney Setting, NP  umeclidinium-vilanterol (ANORO ELLIPTA) 62.5-25 MCG/INH AEPB Inhale into the lungs. 10/04/19   [provider]    Physical Exam: Vitals:   09/25/20 0034 09/25/20 0037 09/25/20 0040 09/25/20 0100  BP:   (!) 165/84 (!) 162/94  Pulse: 71  67 (!) 105  Resp: 20  14 16   Temp:      TempSrc:      SpO2: (!) 88% 91% 93% 92%  Weight:      Height:         Vitals:   09/25/20 0034 09/25/20 0037 09/25/20 0040 09/25/20 0100  BP:   (!) 165/84 (!) 162/94  Pulse: 71  67 (!) 105  Resp: 20  14 16   Temp:      TempSrc:      SpO2: (!) 88% 91% 93% 92%  Weight:      Height:          Constitutional: Alert and oriented x 3 .  Moderate respiratory distress HEENT:      Head: Normocephalic and atraumatic.         Eyes: PERLA, EOMI, Conjunctivae are normal. Sclera is non-icteric.       Mouth/Throat: Mucous membranes are moist.       Neck: Supple with no signs of meningismus. Cardiovascular: Regular rate and rhythm. No murmurs, gallops, or rubs. 2+ symmetrical distal pulses are present . No JVD. No LE edema Respiratory: Respiratory effort increased, speaking in short sentences.Lungs sounds diminished bilaterally.  Rhonchi bilaterally Gastrointestinal: Soft, non tender, and non distended with positive bowel sounds.  Genitourinary: No CVA tenderness. Musculoskeletal: Nontender with normal range of motion in all extremities. No cyanosis, or erythema of extremities. Neurologic:  Face is symmetric. Moving all extremities. No gross focal neurologic deficits . Skin: Skin is warm, dry.  No rash or ulcers Psychiatric: Mood and affect are normal    Labs on Admission: I have personally reviewed following labs and imaging studies  CBC: Recent Labs  Lab 09/24/20 2344  WBC 7.2  NEUTROABS 3.3  HGB 13.9  HCT 43.5  MCV 87.7  PLT 130    Basic Metabolic Panel: Recent Labs  Lab 09/24/20 2344  NA 139  K 3.2*  CL 104  CO2 26  GLUCOSE 188*  BUN 14  CREATININE 0.64  CALCIUM 8.9   GFR: Estimated Creatinine Clearance: 89.9 mL/min (by C-G formula based on SCr of 0.64 mg/dL). Liver Function Tests: Recent Labs  Lab 09/24/20 2344  AST 26  ALT 26  ALKPHOS 85  BILITOT 0.6  PROT 8.1  ALBUMIN 4.3   No results for input(s): LIPASE, AMYLASE in the last 168 hours. No results for input(s): AMMONIA in the last 168 hours. Coagulation Profile: No results for input(s): INR, PROTIME in the last  168 hours. Cardiac Enzymes: No results for input(s): CKTOTAL, CKMB, CKMBINDEX, TROPONINI in the last 168 hours. BNP (last 3 results) No results for input(s): PROBNP in the last 8760 hours. HbA1C: No results for input(s): HGBA1C in the last 72 hours. CBG: No results for input(s): GLUCAP in the last 168 hours. Lipid Profile: No results for input(s): CHOL, HDL, LDLCALC, TRIG, CHOLHDL, LDLDIRECT in the last 72 hours. Thyroid Function Tests: No results for input(s): TSH, T4TOTAL, FREET4, T3FREE, THYROIDAB in the last 72 hours. Anemia Panel: No results for input(s): VITAMINB12, FOLATE, FERRITIN, TIBC, IRON, RETICCTPCT in the last 72 hours. Urine analysis:    Component Value Date/Time   COLORURINE YELLOW (A) 07/29/2016 1335   APPEARANCEUR CLEAR (A) 07/29/2016 1335   LABSPEC 1.018 07/29/2016 1335   PHURINE 6.0 07/29/2016 1335   GLUCOSEU NEGATIVE 07/29/2016 1335   HGBUR LARGE (A) 07/29/2016 1335   BILIRUBINUR NEGATIVE 07/29/2016 1335   KETONESUR NEGATIVE 07/29/2016 1335   PROTEINUR NEGATIVE 07/29/2016 1335   NITRITE NEGATIVE 07/29/2016 1335   LEUKOCYTESUR NEGATIVE 07/29/2016 1335    Radiological Exams on Admission: DG Chest Port 1 View  Result Date: 09/25/2020 CLINICAL DATA:  70 year old female with shortness of breath. EXAM: PORTABLE CHEST 1 VIEW COMPARISON:  Chest radiograph dated 06/15/2020. FINDINGS: Mild interstitial  prominence, likely chronic. No focal consolidation, pleural effusion or pneumothorax. The cardiac silhouette is within limits. No acute osseous pathology. IMPRESSION: No active disease. Electronically Signed   By: Anner Crete M.D.   On: 09/25/2020 00:39     Assessment/Plan 70 year old female with history of nonobstructive CAD, HTN, DM, COPD presenting via EMS in respiratory distress.  O2 sats in the 80s on room air with EMS.Marland Kitchen     COPD exacerbation ([place])   Acute respiratory failure with hypoxia ([place]) -Patient presents with increased work of breathing, speaking in short sentences hypoxic to the 80s on room air with EMS requiring 2 L to maintain sats in the low 90s.  COVID and flu negative.  Chest x-ray no acute disease -Scheduled and as needed bronchodilator treatments -IV steroids to transition to oral -Antitussives -Supplemental oxygen to keep sats over 92  Hypokalemia -Oral repletion and monitor    Essential hypertension -Continue home losartan and metoprolol    Diabetes mellitus without complication (HCC) -Sliding scale insulin coverage  Nonobstructive coronary artery disease -No complaints of chest pain, troponin normal and EKG nonacute -Continue atorvastatin and metoprolol and aspirin    DVT prophylaxis: Lovenox  Code Status: full code  Family Communication:  none  Disposition Plan: Back to previous home environment Consults called: none  Status:At the time of admission, it appears that the appropriate admission status for this patient is INPATIENT. This is judged to be reasonable and necessary in order to provide the required intensity of service to ensure the patient's safety given the presenting symptoms, physical exam findings, and initial radiographic and laboratory data in the context of their  Comorbid conditions.   Patient requires inpatient status due to high intensity of service, high risk for further deterioration and high frequency of surveillance required.    I certify that at the point of admission it is my clinical judgment that the patient will require inpatient hospital care spanning beyond [place] MD [facility]     09/25/2020, 1:45 AM

## 2020-09-25 NOTE — Evaluation (Addendum)
Occupational Therapy Evaluation Patient Details Name: Beth Odonnell MRN: [id number] DOB: [birth year] Today's Date: 09/25/2020    History of Present Illness Beth Odonnell is a 70 y.o. female with medical history significant for Nonobstructive CAD, COPD, HTN, DM who presents to the emergency room by EMS for increased work of breathing. Pt admitted for COPD exacerbation   Clinical Impression   Pt seen for OT evaluation this date. Pt agreeable to OT eval/tx and reporting no pain. Prior to admission, pt was living in a 1-story home with her son in [place] (pt reports she is planning on moving to another son's home in [place]). Prior to admission, pt was mod-independent with ADLs and functional mobility with SPC. Pt was driving, cooking, and caring for her granddaughter (her daughter's daughter). Pt did not use supplemental O2 at home.  Upon arrival to room, pt on 2L O2 via [place] and talking to son on phone with SpO2 91%. During functional conversation with OT while seated upright in bed, pt with increased coughing, elevated heart rate (HR 100-130), and x1 O2 desat (O2 desat 87%). When cued to focus on breathing and limit conversation, HR 100-110 and SpO2 >93%; RN informed. Due to current functional impairments (see below), pt requires MIN A to don R LE sock at bed-level, SUPERVISION to don briefs while standing, and MIN GUARD to walk ~20' w/out AD. Pt was observed to bend over to pick item off floor; no LOB observed and vitals similar to resting rates (HR 100-110 and SpO2 >93%) throughout OOB mobility, with pt limiting conversation and O2 titrated to 3L before mobility. Pt left in bed, on 2L O2 with SpO2 93% and pt in no acute distress. Pt would benefit from additional skilled OT services to ensure carryover of energy conservation strategies into daily routine and maximize return to PLOF. Upon discharge, recommend HHOT services and supervision/assistance from family as needed.    Follow Up  Recommendations  Home health OT;Supervision - Intermittent    Equipment Recommendations  3 in 1 bedside commode       Precautions / Restrictions Restrictions Weight Bearing Restrictions: No      Mobility Bed Mobility Overal bed mobility: Modified Independent             General bed mobility comments: Able to perform supine<>sit transfers MOD-I (with HOB elevated)    Transfers Overall transfer level: Needs assistance Equipment used: None Transfers: Sit to/from Stand Sit to Stand: Supervision              Balance Overall balance assessment: Needs assistance Sitting-balance support: No upper extremity supported;Feet unsupported Sitting balance-Leahy Scale: Good Sitting balance - Comments: Good sitting balance at EOB   Standing balance support: No upper extremity supported;During functional activity Standing balance-Leahy Scale: Fair Standing balance comment: Able to walk ~20' withouth AD; no LOB observed                           ADL either performed or assessed with clinical judgement   ADL Overall ADL's : Needs assistance/impaired                     Lower Body Dressing: Minimal assistance;Sit to/from stand;Bed level Lower Body Dressing Details (indicate cue type and reason): At bed-level MIN A to don R sock in setting of baseline RLE ROM. Pt able to don briefs sit>stand with supervision             Functional mobility  during ADLs: Min guard (MIN GUARD to walk ~20' w/out AD)       Vision Baseline Vision/History: Wears glasses Wears Glasses: At all times              Pertinent Vitals/Pain Pain Assessment: No/denies pain     Hand Dominance Right   Extremity/Trunk Assessment Upper Extremity Assessment Upper Extremity Assessment: Overall WFL for tasks assessed   Lower Extremity Assessment Lower Extremity Assessment: Defer to PT evaluation       Communication Communication Communication: No difficulties   Cognition  Arousal/Alertness: Awake/alert Behavior During Therapy: WFL for tasks assessed/performed Overall Cognitive Status: Within Functional Limits for tasks assessed                                 General Comments: Pt pleasant and agreeable throughout   General Comments  Upon arrival to room, pt on 2L O2 and talking to son on phone with SpO2 91%. During functional conversation with OT while seated upright in bed, pt with increased coughing and elevated heart rate and x1 O2 desat (HR 100-130, RR 20-30, O2 desat 87%). Pt able to tolerate functional mobility (with HR 100-110, and SpO2 >93%) when O2 titrated to 3L, and pt cued to focus on breathing and limit conversation.            Home Living Family/patient expects to be discharged to:: Private residence Living Arrangements: Children (Has been staying with son in [place], however is planning on moving to home of other son in [place]. Home set-up below for [place] home) Available Help at Discharge: Family Type of Home: House Home Access: Level entry     Home Layout: One level     Bathroom Shower/Tub: Teacher, early years/pre: Handicapped height     Home Equipment: Hand held shower head;Grab bars - tub/shower;Cane - single point   Additional Comments: Pt stated that she has a shower chair, but that it is in storage. Pt is planning to obtain QC      Prior Functioning/Environment Level of Independence: Independent with assistive device(s)        Comments: Mod-independent with ADLs and functional mobility with SPC. Pt drives, cooks, and is the caregiver for her granddaughter (her daughter's daughter).        OT Problem List: Decreased activity tolerance;Impaired balance (sitting and/or standing);Cardiopulmonary status limiting activity      OT Treatment/Interventions: Self-care/ADL training;Therapeutic exercise;Energy conservation;DME and/or AE instruction;Therapeutic activities;Patient/family  education;Balance training    OT Goals(Current goals can be found in the care plan section) Acute Rehab OT Goals Patient Stated Goal: To breath better OT Goal Formulation: With patient Time For Goal Achievement: 10/09/20 Potential to Achieve Goals: Fair  OT Frequency: Min 1X/week    AM-PAC OT "6 Clicks" Daily Activity     Outcome Measure Help from another person eating meals?: None Help from another person taking care of personal grooming?: None Help from another person toileting, which includes using toliet, bedpan, or urinal?: A Little Help from another person bathing (including washing, rinsing, drying)?: A Little Help from another person to put on and taking off regular upper body clothing?: None Help from another person to put on and taking off regular lower body clothing?: A Little 6 Click Score: 21   End of Session Equipment Utilized During Treatment: Oxygen Nurse Communication: Mobility status  Activity Tolerance: Patient tolerated treatment well Patient left: in bed;with call bell/phone within reach  OT Visit Diagnosis: Unsteadiness on feet (R26.81)                Time: 7282-0601 OT Time Calculation (min): 37 min Charges:  OT General Charges $OT Visit: 1 Visit OT Evaluation $OT Eval Moderate Complexity: 1 Mod OT Treatments $Therapeutic Activity: 23-37 mins  Fredirick Maudlin, OTR/L [place]

## 2020-09-25 NOTE — ED Notes (Signed)
ED Provider at bedside. 

## 2020-09-25 NOTE — ED Notes (Signed)
Report provided to [facility]. Patient transported to [facility].

## 2020-09-25 NOTE — ED Notes (Signed)
Linens changed x 2 due to urine incontinence when coughing. Ext cath applied.

## 2020-09-25 NOTE — Evaluation (Signed)
Physical Therapy Evaluation Patient Details Name: Beth Odonnell MRN: [id number] DOB: [birth year] Today's Date: 09/25/2020   History of Present Illness  Beth Odonnell is a 70 y.o. female with medical history significant for Nonobstructive CAD, COPD, HTN, DM who presents to the emergency room by EMS for increased work of breathing. Pt admitted for COPD exacerbation    Clinical Impression  Pt sitting EOB upon arrival to room with nursing giving meds.  Pt is demonstrates adequate strength in sitting position with good balance throughout.  Pt is able to transfer and ambulate independently without any AD.  Pt states she does use a cane at times when her R knee is hurting from OA.  Pt notes her family member is bringing her cane to the hospital tomorrow.  Patient is at baseline, all education completed, and time is given to address all questions/concerns. No additional skilled PT services needed at this time, PT signing off.  PT recommends daily ambulation ad lib or with nursing staff as needed to prevent deconditioning.      Follow Up Recommendations No PT follow up    Equipment Recommendations  None recommended by PT    Recommendations for Other Services       Precautions / Restrictions Restrictions Weight Bearing Restrictions: No      Mobility  Bed Mobility Overal bed mobility: Independent                  Transfers Overall transfer level: Independent   Transfers: Sit to/from Stand Sit to Stand: Supervision         General transfer comment: Supervision for mobility, but not required.  Ambulation/Gait   Gait Distance (Feet): 25 Feet Assistive device: None Gait Pattern/deviations: WFL(Within Functional Limits) Gait velocity: WNL   General Gait Details: Pt able to ambulate in the room without any AD or difficulty.  Stairs            Wheelchair Mobility    Modified Rankin (Stroke Patients Only)       Balance                                              Pertinent Vitals/Pain      Home Living Family/patient expects to be discharged to:: Private residence Living Arrangements: Children (Has been staying with son in [place], however is planning on moving to home of other son in [place]. Home set-up below for [place] home) Available Help at Discharge: Family Type of Home: House Home Access: Level entry     Home Layout: One level Home Equipment: Hand held shower head;Grab bars - tub/shower;Cane - single point Additional Comments: Pt stated that she has a shower chair, but that it is in storage. Pt is planning to obtain QC    Prior Function Level of Independence: Independent with assistive device(s)         Comments: Mod-independent with ADLs and functional mobility with SPC. Pt drives, cooks, and is the caregiver for her granddaughter (her daughter's daughter).     Hand Dominance   Dominant Hand: Right    Extremity/Trunk Assessment   Upper Extremity Assessment Upper Extremity Assessment: Defer to OT evaluation    Lower Extremity Assessment Lower Extremity Assessment: Overall WFL for tasks assessed       Communication   Communication: No difficulties  Cognition Arousal/Alertness: Awake/alert Behavior During Therapy: WFL for tasks assessed/performed Overall  Cognitive Status: Within Functional Limits for tasks assessed                                 General Comments: Pt pleasant and agreeable throughout      General Comments      Exercises     Assessment/Plan    PT Assessment Patent does not need any further PT services  PT Problem List         PT Treatment Interventions      PT Goals (Current goals can be found in the Care Plan section)  Acute Rehab PT Goals Patient Stated Goal: to breathe better PT Goal Formulation: With patient Time For Goal Achievement: 10/09/20 Potential to Achieve Goals: Good    Frequency     Barriers to discharge         Co-evaluation               AM-PAC PT "6 Clicks" Mobility  Outcome Measure Help needed turning from your back to your side while in a flat bed without using bedrails?: None Help needed moving from lying on your back to sitting on the side of a flat bed without using bedrails?: None Help needed moving to and from a bed to a chair (including a wheelchair)?: None Help needed standing up from a chair using your arms (e.g., wheelchair or bedside chair)?: None Help needed to walk in hospital room?: None Help needed climbing 3-5 steps with a railing? : None 6 Click Score: 24    End of Session   Activity Tolerance: Patient tolerated treatment well Patient left: in bed Nurse Communication: Mobility status      Time: 1740-1752 PT Time Calculation (min) (ACUTE ONLY): 12 min   Charges:   PT Evaluation $PT Eval Low Complexity: 1 Low          Gwenlyn Saran, PT, DPT 09/25/20, 6:13 PM

## 2020-09-25 NOTE — ED Notes (Signed)
Pt in coughing fit.Coarse breath sounds bilaterally with exp wheezing. Breathing treatment administered. methylpred administered.

## 2020-09-25 NOTE — ED Notes (Signed)
Assisted patient in using phone so she could call her son.

## 2020-09-25 NOTE — ED Notes (Signed)
Pt woke up with persistent cough. Sitting upright in bed coughing. Pt medicated per PRN order and discussed POC.

## 2020-09-25 NOTE — Plan of Care (Addendum)
Patient seen and rounded on this morning. Beth Odonnell is a 70 year old female with PMH COPD, ongoing tobacco use, nonobstructive CAD, hypertension, type 2 diabetes who presents to the ER for worsening shortness of breath.  She had been seen in urgent care prior to admission as well.  Due to her ongoing shortness of breath she presented for further evaluation. She endorsed ongoing tobacco use recently but has not smoked in a couple days due to feeling poorly. She was admitted for work-up and evaluation.  CXR showed relatively clear lungs.  Her work-up was consistent with COPD exacerbation and she was started on steroids and breathing treatments.  When seen in the ER the morning following admission, she stated that she was already breathing better especially with nebulizers.  Plan: -continue steroids and nebulizers -Continue oxygen and wean off as able.  Not on chronic oxygen at home -Smoking cessation again strongly encouraged to patient.  She denied nicotine patch due to dermatitis from the patch in the past -See H&P for further plan  No charge for note.  Dwyane Dee, MD [facility] 09/25/2020, 2:23 PM

## 2020-09-26 DIAGNOSIS — J441 Chronic obstructive pulmonary disease with (acute) exacerbation: Secondary | ICD-10-CM | POA: Diagnosis not present

## 2020-09-26 LAB — CBC WITH DIFFERENTIAL/PLATELET
Abs Immature Granulocytes: 0.07 10*3/uL (ref 0.00–0.07)
Basophils Absolute: 0 10*3/uL (ref 0.0–0.1)
Basophils Relative: 0 %
Eosinophils Absolute: 0 10*3/uL (ref 0.0–0.5)
Eosinophils Relative: 0 %
HCT: 38.9 % (ref 36.0–46.0)
Hemoglobin: 12.6 g/dL (ref 12.0–15.0)
Immature Granulocytes: 1 %
Lymphocytes Relative: 11 %
Lymphs Abs: 1.3 10*3/uL (ref 0.7–4.0)
MCH: 28.3 pg (ref 26.0–34.0)
MCHC: 32.4 g/dL (ref 30.0–36.0)
MCV: 87.2 fL (ref 80.0–100.0)
Monocytes Absolute: 0.7 10*3/uL (ref 0.1–1.0)
Monocytes Relative: 6 %
Neutro Abs: 9.7 10*3/uL — ABNORMAL HIGH (ref 1.7–7.7)
Neutrophils Relative %: 82 %
Platelets: 258 10*3/uL (ref 150–400)
RBC: 4.46 MIL/uL (ref 3.87–5.11)
RDW: 14.3 % (ref 11.5–15.5)
WBC: 11.8 10*3/uL — ABNORMAL HIGH (ref 4.0–10.5)
nRBC: 0 % (ref 0.0–0.2)

## 2020-09-26 LAB — BASIC METABOLIC PANEL
Anion gap: 9 (ref 5–15)
BUN: 26 mg/dL — ABNORMAL HIGH (ref 8–23)
CO2: 24 mmol/L (ref 22–32)
Calcium: 9.1 mg/dL (ref 8.9–10.3)
Chloride: 107 mmol/L (ref 98–111)
Creatinine, Ser: 0.71 mg/dL (ref 0.44–1.00)
GFR, Estimated: >60 mL/min (ref >60)
Glucose, Bld: 259 mg/dL — ABNORMAL HIGH (ref 70–99)
Potassium: 4.1 mmol/L (ref 3.5–5.1)
Sodium: 140 mmol/L (ref 135–145)

## 2020-09-26 LAB — MAGNESIUM: Magnesium: 2 mg/dL (ref 1.7–2.4)

## 2020-09-26 LAB — GLUCOSE, CAPILLARY
Glucose-Capillary: 276 mg/dL — ABNORMAL HIGH (ref 70–99)
Glucose-Capillary: 190 mg/dL — ABNORMAL HIGH (ref 70–99)
Glucose-Capillary: 211 mg/dL — ABNORMAL HIGH (ref 70–99)
Glucose-Capillary: 170 mg/dL — ABNORMAL HIGH (ref 70–99)

## 2020-09-26 MED ORDER — GUAIFENESIN ER 600 MG PO TB12
1200.0000 mg | ORAL_TABLET | Freq: Two times a day (BID) | ORAL | Status: DC
  Administered 2020-09-26 – 2020-09-27 (×2): 1200 mg via ORAL
  Filled 2020-09-26 (×2): qty 2

## 2020-09-26 MED ORDER — AMLODIPINE BESYLATE 10 MG PO TABS
10.0000 mg | ORAL_TABLET | Freq: Every day | ORAL | Status: DC
  Administered 2020-09-26 – 2020-09-27 (×2): 10 mg via ORAL
  Filled 2020-09-26 (×3): qty 1

## 2020-09-26 MED ORDER — IPRATROPIUM-ALBUTEROL 0.5-2.5 (3) MG/3ML IN SOLN
3.0000 mL | Freq: Two times a day (BID) | RESPIRATORY_TRACT | Status: DC
  Administered 2020-09-26 – 2020-09-27 (×2): 3 mL via RESPIRATORY_TRACT
  Filled 2020-09-26 (×2): qty 3

## 2020-09-26 MED ORDER — SODIUM CHLORIDE 3 % IN NEBU
3.0000 mL | INHALATION_SOLUTION | Freq: Every day | RESPIRATORY_TRACT | Status: DC
  Administered 2020-09-27: 3 mL via RESPIRATORY_TRACT
  Filled 2020-09-26 (×2): qty 4

## 2020-09-26 NOTE — Progress Notes (Signed)
PROGRESS NOTE  Beth Odonnell VVO:[id number] DOB: 03/25/1951 DOA: 09/24/2020 PCP: Donnie Coffin, MD  HPI/Recap of past 24 hours: Patient seen and rounded on this morning. Beth Odonnell is a 70 year old female with PMH COPD, ongoing tobacco use, nonobstructive CAD, hypertension, type 2 diabetes who presents to the ER for worsening shortness of breath.  She had been seen in urgent care prior to admission as well.  Due to her ongoing shortness of breath she presented for further evaluation. She endorsed ongoing tobacco use recently but has not smoked in a couple days due to feeling poorly. She was admitted for work-up and evaluation.  CXR showed relatively clear lungs.  Her work-up was consistent with COPD exacerbation and she was started on steroids and breathing treatments.  When seen in the ER the morning following admission, she stated that she was already breathing better especially with nebulizers.  09/26/20: Seen and examined at her bedside.  She reports intermittent coughing spells.  States her breathing is now back to her baseline.  Assessment/Plan: Principal Problem:   COPD exacerbation (HCC) Active Problems:   Hypokalemia   Essential hypertension   Acute respiratory failure with hypoxia (HCC)   Diabetes mellitus without complication (HCC)   COPD exacerbation ([place])   Acute respiratory failure with hypoxia ([place]) -Patient presents with increased work of breathing, speaking in short sentences hypoxic to the 80s on room air with EMS requiring 2 L to maintain sats in the low 90s.  COVID and flu negative.  Chest x-ray no acute disease -Scheduled and as needed bronchodilator treatments -IV steroids to transition to oral -Antitussives -Supplemental oxygen to keep sats over 92  Resolved post repletion: Hypokalemia -Oral repletion and monitor    Essential hypertension -Continue home losartan and metoprolol  Type 2 diabetes with hyperglycemia -Sliding scale insulin coverage A1c  8.8 on 09/25/2020  Nonobstructive coronary artery disease -No complaints of chest pain, troponin normal and EKG nonacute -Continue atorvastatin and metoprolol and aspirin    DVT prophylaxis: Lovenox subcu daily Code Status: full code  Family Communication:  none  Disposition Plan: Back to previous home environment Consults called: none         Objective: Vitals:   09/26/20 0428 09/26/20 0737 09/26/20 0854 09/26/20 1127  BP: (!) 166/98  132/84 (!) 190/91  Pulse: 96  95 (!) 108  Resp: 18  20 18   Temp: 97.6 F (36.4 C)  97.8 F (36.6 C) 98.1 F (36.7 C)  TempSrc: Oral   Oral  SpO2: 98% 96% 98% 100%  Weight:      Height:        Intake/Output Summary (Last 24 hours) at 09/26/2020 1439 Last data filed at 09/26/2020 1426 Gross per 24 hour  Intake 720 ml  Output 700 ml  Net 20 ml   Filed Weights   09/24/20 2345  Weight: 105.2 kg    Exam:  . General: 70 y.o. year-old female well developed well nourished in no acute distress.  Alert and oriented x3. . Cardiovascular: Regular rate and rhythm with no rubs or gallops.  No thyromegaly or JVD noted.   Marland Kitchen Respiratory: Clear to auscultation with no wheezes or rales. Good inspiratory effort. . Abdomen: Soft nontender nondistended with normal bowel sounds x4 quadrants. . Musculoskeletal: No lower extremity edema. 2/4 pulses in all 4 extremities. . Skin: No ulcerative lesions noted or rashes, . Psychiatry: Mood is appropriate for condition and setting   Data Reviewed: CBC: Recent Labs  Lab 09/24/20 2344 09/26/20 0548  WBC 7.2 11.8*  NEUTROABS 3.3 9.7*  HGB 13.9 12.6  HCT 43.5 38.9  MCV 87.7 87.2  PLT 286 350   Basic Metabolic Panel: Recent Labs  Lab 09/24/20 2344 09/26/20 0548  NA 139 140  K 3.2* 4.1  CL 104 107  CO2 26 24  GLUCOSE 188* 259*  BUN 14 26*  CREATININE 0.64 0.71  CALCIUM 8.9 9.1  MG  --  2.0   GFR: Estimated Creatinine Clearance: 89.9 mL/min (by C-G formula based on SCr of 0.71  mg/dL). Liver Function Tests: Recent Labs  Lab 09/24/20 2344  AST 26  ALT 26  ALKPHOS 85  BILITOT 0.6  PROT 8.1  ALBUMIN 4.3   No results for input(s): LIPASE, AMYLASE in the last 168 hours. No results for input(s): AMMONIA in the last 168 hours. Coagulation Profile: No results for input(s): INR, PROTIME in the last 168 hours. Cardiac Enzymes: No results for input(s): CKTOTAL, CKMB, CKMBINDEX, TROPONINI in the last 168 hours. BNP (last 3 results) No results for input(s): PROBNP in the last 8760 hours. HbA1C: Recent Labs    09/25/20 0150  HGBA1C 8.8*   CBG: Recent Labs  Lab 09/25/20 1246 09/25/20 1716 09/25/20 2129 09/26/20 0853 09/26/20 1230  GLUCAP 293* 249* 230* 276* 190*   Lipid Profile: No results for input(s): CHOL, HDL, LDLCALC, TRIG, CHOLHDL, LDLDIRECT in the last 72 hours. Thyroid Function Tests: No results for input(s): TSH, T4TOTAL, FREET4, T3FREE, THYROIDAB in the last 72 hours. Anemia Panel: No results for input(s): VITAMINB12, FOLATE, FERRITIN, TIBC, IRON, RETICCTPCT in the last 72 hours. Urine analysis:    Component Value Date/Time   COLORURINE YELLOW (A) 07/29/2016 1335   APPEARANCEUR CLEAR (A) 07/29/2016 1335   LABSPEC 1.018 07/29/2016 1335   PHURINE 6.0 07/29/2016 1335   GLUCOSEU NEGATIVE 07/29/2016 1335   HGBUR LARGE (A) 07/29/2016 1335   BILIRUBINUR NEGATIVE 07/29/2016 1335   KETONESUR NEGATIVE 07/29/2016 1335   PROTEINUR NEGATIVE 07/29/2016 1335   NITRITE NEGATIVE 07/29/2016 1335   LEUKOCYTESUR NEGATIVE 07/29/2016 1335   Sepsis Labs: @LABRCNTIP (procalcitonin:4,lacticidven:4)  ) Recent Results (from the past 240 hour(s))  Resp Panel by RT-PCR (Flu A&B, Covid) Nasopharyngeal Swab     Status: None   Collection Time: 09/24/20 11:42 PM   Specimen: Nasopharyngeal Swab; Nasopharyngeal(NP) swabs in vial transport medium  Result Value Ref Range Status   SARS Coronavirus 2 by RT PCR NEGATIVE NEGATIVE Final    Comment: (NOTE) SARS-CoV-2  target nucleic acids are NOT DETECTED.  The SARS-CoV-2 RNA is generally detectable in upper respiratory specimens during the acute phase of infection. The lowest concentration of SARS-CoV-2 viral copies this assay can detect is 138 copies/mL. A negative result does not preclude SARS-Cov-2 infection and should not be used as the sole basis for treatment or other patient management decisions. A negative result may occur with  improper specimen collection/handling, submission of specimen other than nasopharyngeal swab, presence of viral mutation(s) within the areas targeted by this assay, and inadequate number of viral copies(<138 copies/mL). A negative result must be combined with clinical observations, patient history, and epidemiological information. The expected result is Negative.  Fact Sheet for Patients:  EntrepreneurPulse.com.au  Fact Sheet for Healthcare Providers:  IncredibleEmployment.be  This test is no t yet approved or cleared by the [place] FDA and  has been authorized for detection and/or diagnosis of SARS-CoV-2 by FDA under an Emergency Use Authorization (EUA). This EUA will remain  in effect (meaning this test can be used) for the  duration of the COVID-19 declaration under Section 564(b)(1) of the Act, 21 U.S.C.section 360bbb-3(b)(1), unless the authorization is terminated  or revoked sooner.       Influenza A by PCR NEGATIVE NEGATIVE Final   Influenza B by PCR NEGATIVE NEGATIVE Final    Comment: (NOTE) The Xpert Xpress SARS-CoV-2/FLU/RSV plus assay is intended as an aid in the diagnosis of influenza from Nasopharyngeal swab specimens and should not be used as a sole basis for treatment. Nasal washings and aspirates are unacceptable for Xpert Xpress SARS-CoV-2/FLU/RSV testing.  Fact Sheet for Patients: EntrepreneurPulse.com.au  Fact Sheet for Healthcare  Providers: IncredibleEmployment.be  This test is not yet approved or cleared by the [place] FDA and has been authorized for detection and/or diagnosis of SARS-CoV-2 by FDA under an Emergency Use Authorization (EUA). This EUA will remain in effect (meaning this test can be used) for the duration of the COVID-19 declaration under Section 564(b)(1) of the Act, 21 U.S.C. section 360bbb-3(b)(1), unless the authorization is terminated or revoked.  Performed at [facility], [address] [place], [place] [address]       Studies: No results found.  Scheduled Meds: . amLODipine  10 mg Oral Daily  . enoxaparin (LOVENOX) injection  0.5 mg/kg Subcutaneous Q24H  . insulin aspart  0-20 Units Subcutaneous TID WC  . insulin aspart  0-5 Units Subcutaneous QHS  . ipratropium-albuterol  3 mL Nebulization Q6H  . predniSONE  40 mg Oral Q breakfast    Continuous Infusions:   LOS: 1 day     Kayleen Memos, MD [facility] Pager [phone]  If 7PM-7AM, please contact night-coverage [email or web address] Password [facility] 09/26/2020, 2:39 PM

## 2020-09-26 NOTE — Progress Notes (Signed)
Nutrition Brief Note  RD consulted for assessment of nutrition requirements/ status.   Wt Readings from Last 15 Encounters:  09/24/20 105.2 kg  09/13/19 104.9 kg  05/28/18 94 kg  07/29/16 94 kg   Beth Odonnell is a 70 y.o. female with medical history significant for Nonobstructive CAD, COPD, HTN, DM who presents to the emergency room by EMS for increased work of breathing.  Was seen earlier in the urgent care for cough and shortness of breath x2 days and was treated with DuoNeb and felt better however Later that evening while shopping she became very dyspneic.  She denied chest pain, fever or chills, pain or swelling in the legs.  EMS reported O2 sat in the 80s.  She received a DuoNeb, Solu-Medrol and route and placed on oxygen  Pt admitted with COPD exacerbation.   Reviewed I/O's: -400 ml x 24 hours and -550 ml since admission  UOP: 400 ml x 24 hours  Spoke with pt at bedside, who reports good appetite. She consumed 100% of her breakfast. PTA she reports consuming 2-3 meals per day, which consist of a meat, starch, and vegetable.   Pt denies any weight loss. She reports UBW is around 210#. Pt reports mild wt gain over the past year, due to being less active.   Nutrition-Focused physical exam completed. Findings are no fat depletion, no muscle depletion, and no edema.   Labs reviewed: CBGS: 190-276.   Body mass index is 32.36 kg/m. Patient meets criteria for obesity, class I based on current BMI.   Current diet order is heart healthy/ carb modified, patient is consuming approximately 100% of meals at this time. Labs and medications reviewed.   No nutrition interventions warranted at this time. If nutrition issues arise, please consult RD.   Loistine Chance, RD, LDN, [place] Registered Dietitian II Certified Diabetes Care and Education Specialist Please refer to [facility] for RD and/or RD on-call/weekend/after hours pager

## 2020-09-26 NOTE — Progress Notes (Signed)
Inpatient Diabetes Program Recommendations  AACE/ADA: New Consensus Statement on Inpatient Glycemic Control (2015)  Target Ranges:  Prepandial:   less than 140 mg/dL      Peak postprandial:   less than 180 mg/dL (1-2 hours)      Critically ill patients:  140 - 180 mg/dL   Lab Results  Component Value Date   GLUCAP 276 (H) 09/26/2020   HGBA1C 8.8 (H) 09/25/2020    Review of Glycemic Control Results for Beth Odonnell, Beth Odonnell (MRN [id number]) as of 09/26/2020 10:09  Ref. Range 09/25/2020 08:02 09/25/2020 12:46 09/25/2020 17:16 09/25/2020 21:29 09/26/2020 08:53  Glucose-Capillary Latest Ref Range: 70 - 99 mg/dL 233 (H) 293 (H) 249 (H) 230 (H) 276 (H)   Diabetes history: DM2 Outpatient Diabetes medications: None Current orders for Inpatient glycemic control: Novolog 0-20 units TID, 0-5 units QHS, Prednisone 40 mg daily   Inpatient Diabetes Program Recommendations:     Lantus 16 units daily (0.15 units/kg)  Will continue to follow while inpatient.  Thank you, Reche Dixon, RN, BSN Diabetes Coordinator Inpatient Diabetes Program [phone] (team pager from 8a-5p)

## 2020-09-27 DIAGNOSIS — J441 Chronic obstructive pulmonary disease with (acute) exacerbation: Secondary | ICD-10-CM | POA: Diagnosis not present

## 2020-09-27 LAB — CBC WITH DIFFERENTIAL/PLATELET
Abs Immature Granulocytes: 0.06 K/uL (ref 0.00–0.07)
Basophils Absolute: 0 K/uL (ref 0.0–0.1)
Basophils Relative: 0 %
Eosinophils Absolute: 0.1 K/uL (ref 0.0–0.5)
Eosinophils Relative: 1 %
HCT: 40.8 % (ref 36.0–46.0)
Hemoglobin: 13.1 g/dL (ref 12.0–15.0)
Immature Granulocytes: 1 %
Lymphocytes Relative: 29 %
Lymphs Abs: 3.1 K/uL (ref 0.7–4.0)
MCH: 27.7 pg (ref 26.0–34.0)
MCHC: 32.1 g/dL (ref 30.0–36.0)
MCV: 86.3 fL (ref 80.0–100.0)
Monocytes Absolute: 0.7 K/uL (ref 0.1–1.0)
Monocytes Relative: 6 %
Neutro Abs: 6.9 K/uL (ref 1.7–7.7)
Neutrophils Relative %: 63 %
Platelets: 267 K/uL (ref 150–400)
RBC: 4.73 MIL/uL (ref 3.87–5.11)
RDW: 14.1 % (ref 11.5–15.5)
WBC: 10.8 K/uL — ABNORMAL HIGH (ref 4.0–10.5)
nRBC: 0 % (ref 0.0–0.2)

## 2020-09-27 LAB — BASIC METABOLIC PANEL WITH GFR
Anion gap: 6 (ref 5–15)
BUN: 13 mg/dL (ref 8–23)
CO2: 31 mmol/L (ref 22–32)
Calcium: 9 mg/dL (ref 8.9–10.3)
Chloride: 105 mmol/L (ref 98–111)
Creatinine, Ser: 0.64 mg/dL (ref 0.44–1.00)
GFR, Estimated: >60 mL/min
Glucose, Bld: 152 mg/dL — ABNORMAL HIGH (ref 70–99)
Potassium: 3.4 mmol/L — ABNORMAL LOW (ref 3.5–5.1)
Sodium: 142 mmol/L (ref 135–145)

## 2020-09-27 LAB — MAGNESIUM: Magnesium: 2 mg/dL (ref 1.7–2.4)

## 2020-09-27 LAB — GLUCOSE, CAPILLARY
Glucose-Capillary: 137 mg/dL — ABNORMAL HIGH (ref 70–99)
Glucose-Capillary: 227 mg/dL — ABNORMAL HIGH (ref 70–99)

## 2020-09-27 MED ORDER — METFORMIN HCL 850 MG PO TABS: 850.0000 mg | ORAL_TABLET | Freq: Two times a day (BID) | ORAL | 0 refills | Status: AC

## 2020-09-27 MED ORDER — DOXYCYCLINE HYCLATE 100 MG PO TABS
100.0000 mg | ORAL_TABLET | Freq: Two times a day (BID) | ORAL | Status: DC
  Administered 2020-09-27: 09:00:00 100 mg via ORAL
  Filled 2020-09-27: qty 1

## 2020-09-27 MED ORDER — ATORVASTATIN CALCIUM 20 MG PO TABS: 20.0000 mg | ORAL_TABLET | Freq: Every day | ORAL | 0 refills | Status: AC

## 2020-09-27 MED ORDER — ALBUTEROL SULFATE HFA 108 (90 BASE) MCG/ACT IN AERS: 1.0000 | INHALATION_SPRAY | Freq: Four times a day (QID) | RESPIRATORY_TRACT | 0 refills | Status: DC | PRN

## 2020-09-27 MED ORDER — ATORVASTATIN CALCIUM 20 MG PO TABS: 20.0000 mg | ORAL_TABLET | Freq: Every day | ORAL | Status: DC

## 2020-09-27 MED ORDER — ALBUTEROL SULFATE (2.5 MG/3ML) 0.083% IN NEBU: 2.5000 mg | INHALATION_SOLUTION | RESPIRATORY_TRACT | 0 refills | Status: DC | PRN

## 2020-09-27 MED ORDER — METFORMIN HCL 850 MG PO TABS
850.0000 mg | ORAL_TABLET | Freq: Two times a day (BID) | ORAL | Status: DC
  Filled 2020-09-27: qty 1

## 2020-09-27 MED ORDER — AMLODIPINE BESYLATE 10 MG PO TABS
10.0000 mg | ORAL_TABLET | Freq: Every day | ORAL | 0 refills | Status: AC
Start: 2020-09-28 — End: 2023-03-30

## 2020-09-27 MED ORDER — ASPIRIN EC 81 MG PO TBEC
81.0000 mg | DELAYED_RELEASE_TABLET | Freq: Every day | ORAL | Status: DC
  Administered 2020-09-27: 13:00:00 81 mg via ORAL
  Filled 2020-09-27: qty 1

## 2020-09-27 MED ORDER — DOXYCYCLINE HYCLATE 100 MG PO TABS: 100.0000 mg | ORAL_TABLET | Freq: Two times a day (BID) | ORAL | 0 refills | Status: AC

## 2020-09-27 MED ORDER — ASPIRIN 81 MG PO TBEC
81.0000 mg | DELAYED_RELEASE_TABLET | Freq: Every day | ORAL | 0 refills | Status: AC
Start: 2020-09-27 — End: 2020-12-26

## 2020-09-27 MED ORDER — PREDNISONE 20 MG PO TABS: 40.0000 mg | ORAL_TABLET | Freq: Every day | ORAL | 0 refills | Status: AC

## 2020-09-27 MED ORDER — POTASSIUM CHLORIDE CRYS ER 20 MEQ PO TBCR
40.0000 meq | EXTENDED_RELEASE_TABLET | Freq: Once | ORAL | Status: AC
  Administered 2020-09-27: 13:00:00 40 meq via ORAL
  Filled 2020-09-27: qty 2

## 2020-09-27 NOTE — Progress Notes (Signed)
Pt Sp02 on room air at rest 95%  Pt able to maintain Sp02 on room air while ambulating at 93%

## 2020-09-27 NOTE — Discharge Instructions (Signed)
https://www.nhlbi.nih.gov/files/docs/public/heart/dash_brief.pdf">  DASH Eating Plan DASH stands for Dietary Approaches to Stop Hypertension. The DASH eating plan is a healthy eating plan that has been shown to:  Reduce high blood pressure (hypertension).  Reduce your risk for type 2 diabetes, heart disease, and stroke.  Help with weight loss. What are tips for following this plan? Reading food labels  Check food labels for the amount of salt (sodium) per serving. Choose foods with less than 5 percent of the Daily Value of sodium. Generally, foods with less than 300 milligrams (mg) of sodium per serving fit into this eating plan.  To find whole grains, look for the word "whole" as the first word in the ingredient list. Shopping  Buy products labeled as "low-sodium" or "no salt added."  Buy fresh foods. Avoid canned foods and pre-made or frozen meals. Cooking  Avoid adding salt when cooking. Use salt-free seasonings or herbs instead of table salt or sea salt. Check with your health care provider or pharmacist before using salt substitutes.  Do not fry foods. Cook foods using healthy methods such as baking, boiling, grilling, roasting, and broiling instead.  Cook with heart-healthy oils, such as olive, canola, avocado, soybean, or sunflower oil. Meal planning  Eat a balanced diet that includes: ? 4 or more servings of fruits and 4 or more servings of vegetables each day. Try to fill one-half of your plate with fruits and vegetables. ? 6-8 servings of whole grains each day. ? Less than 6 oz (170 g) of lean meat, poultry, or fish each day. A 3-oz (85-g) serving of meat is about the same size as a deck of cards. One egg equals 1 oz (28 g). ? 2-3 servings of low-fat dairy each day. One serving is 1 cup (237 mL). ? 1 serving of nuts, seeds, or beans 5 times each week. ? 2-3 servings of heart-healthy fats. Healthy fats called omega-3 fatty acids are found in foods such as walnuts,  flaxseeds, fortified milks, and eggs. These fats are also found in cold-water fish, such as sardines, salmon, and mackerel.  Limit how much you eat of: ? Canned or prepackaged foods. ? Food that is high in trans fat, such as some fried foods. ? Food that is high in saturated fat, such as fatty meat. ? Desserts and other sweets, sugary drinks, and other foods with added sugar. ? Full-fat dairy products.  Do not salt foods before eating.  Do not eat more than 4 egg yolks a week.  Try to eat at least 2 vegetarian meals a week.  Eat more home-cooked food and less restaurant, buffet, and fast food.   Lifestyle  When eating at a restaurant, ask that your food be prepared with less salt or no salt, if possible.  If you drink alcohol: ? Limit how much you use to:  0-1 drink a day for women who are not pregnant.  0-2 drinks a day for men. ? Be aware of how much alcohol is in your drink. In the [place] one drink equals one 12 oz bottle of beer (355 mL), one 5 oz glass of wine (148 mL), or one 1 oz glass of hard liquor (44 mL). General information  Avoid eating more than 2,300 mg of salt a day. If you have hypertension, you may need to reduce your sodium intake to 1,500 mg a day.  Work with your health care provider to maintain a healthy body weight or to lose weight. Ask what an ideal weight is for   you.  Get at least 30 minutes of exercise that causes your heart to beat faster (aerobic exercise) most days of the week. Activities may include walking, swimming, or biking.  Work with your health care provider or dietitian to adjust your eating plan to your individual calorie needs. What foods should I eat? Fruits All fresh, dried, or frozen fruit. Canned fruit in natural juice (without added sugar). Vegetables Fresh or frozen vegetables (raw, steamed, roasted, or grilled). Low-sodium or reduced-sodium tomato and vegetable juice. Low-sodium or reduced-sodium tomato sauce and tomato paste.  Low-sodium or reduced-sodium canned vegetables. Grains Whole-grain or whole-wheat bread. Whole-grain or whole-wheat pasta. Brown rice. Oatmeal. Quinoa. Bulgur. Whole-grain and low-sodium cereals. Pita bread. Low-fat, low-sodium crackers. Whole-wheat flour tortillas. Meats and other proteins Skinless chicken or turkey. Ground chicken or turkey. Pork with fat trimmed off. Fish and seafood. Egg whites. Dried beans, peas, or lentils. Unsalted nuts, nut butters, and seeds. Unsalted canned beans. Lean cuts of beef with fat trimmed off. Low-sodium, lean precooked or cured meat, such as sausages or meat loaves. Dairy Low-fat (1%) or fat-free (skim) milk. Reduced-fat, low-fat, or fat-free cheeses. Nonfat, low-sodium ricotta or cottage cheese. Low-fat or nonfat yogurt. Low-fat, low-sodium cheese. Fats and oils Soft margarine without trans fats. Vegetable oil. Reduced-fat, low-fat, or light mayonnaise and salad dressings (reduced-sodium). Canola, safflower, olive, avocado, soybean, and sunflower oils. Avocado. Seasonings and condiments Herbs. Spices. Seasoning mixes without salt. Other foods Unsalted popcorn and pretzels. Fat-free sweets. The items listed above may not be a complete list of foods and beverages you can eat. Contact a dietitian for more information. What foods should I avoid? Fruits Canned fruit in a light or heavy syrup. Fried fruit. Fruit in cream or butter sauce. Vegetables Creamed or fried vegetables. Vegetables in a cheese sauce. Regular canned vegetables (not low-sodium or reduced-sodium). Regular canned tomato sauce and paste (not low-sodium or reduced-sodium). Regular tomato and vegetable juice (not low-sodium or reduced-sodium). Pickles. Olives. Grains Baked goods made with fat, such as croissants, muffins, or some breads. Dry pasta or rice meal packs. Meats and other proteins Fatty cuts of meat. Ribs. Fried meat. Bacon. Bologna, salami, and other precooked or cured meats, such as  sausages or meat loaves. Fat from the back of a pig (fatback). Bratwurst. Salted nuts and seeds. Canned beans with added salt. Canned or smoked fish. Whole eggs or egg yolks. Chicken or turkey with skin. Dairy Whole or 2% milk, cream, and half-and-half. Whole or full-fat cream cheese. Whole-fat or sweetened yogurt. Full-fat cheese. Nondairy creamers. Whipped toppings. Processed cheese and cheese spreads. Fats and oils Butter. Stick margarine. Lard. Shortening. Ghee. Bacon fat. Tropical oils, such as coconut, palm kernel, or palm oil. Seasonings and condiments Onion salt, garlic salt, seasoned salt, table salt, and sea salt. Worcestershire sauce. Tartar sauce. Barbecue sauce. Teriyaki sauce. Soy sauce, including reduced-sodium. Steak sauce. Canned and packaged gravies. Fish sauce. Oyster sauce. Cocktail sauce. Store-bought horseradish. Ketchup. Mustard. Meat flavorings and tenderizers. Bouillon cubes. Hot sauces. Pre-made or packaged marinades. Pre-made or packaged taco seasonings. Relishes. Regular salad dressings. Other foods Salted popcorn and pretzels. The items listed above may not be a complete list of foods and beverages you should avoid. Contact a dietitian for more information. Where to find more information  National Heart, Lung, and Blood Institute: www.nhlbi.nih.gov  American Heart Association: www.heart.org  Academy of Nutrition and Dietetics: www.eatright.org  National Kidney Foundation: www.kidney.org Summary  The DASH eating plan is a healthy eating plan that has been shown to   reduce high blood pressure (hypertension). It may also reduce your risk for type 2 diabetes, heart disease, and stroke.  When on the DASH eating plan, aim to eat more fresh fruits and vegetables, whole grains, lean proteins, low-fat dairy, and heart-healthy fats.  With the DASH eating plan, you should limit salt (sodium) intake to 2,300 mg a day. If you have hypertension, you may need to reduce your  sodium intake to 1,500 mg a day.  Work with your health care provider or dietitian to adjust your eating plan to your individual calorie needs. This information is not intended to replace advice given to you by your health care provider. Make sure you discuss any questions you have with your health care provider. Document Revised: 05/27/2019 Document Reviewed: 05/27/2019 Elsevier Patient Education  2021 [place]. Chronic Obstructive Pulmonary Disease Exacerbation  Chronic obstructive pulmonary disease (COPD) is a long-term (chronic) condition that affects the lungs. COPD is a general term that can be used to describe many different lung problems that cause lung swelling (inflammation) and limit airflow, including chronic bronchitis and emphysema. COPD exacerbations are episodes when breathing symptoms become much worse and require extra treatment. COPD exacerbations are usually caused by infections. Without treatment, COPD exacerbations can be severe and even life threatening. Frequent COPD exacerbations can cause further damage to the lungs. What are the causes? This condition may be caused by:  Respiratory infections, including viral and bacterial infections.  Exposure to smoke.  Exposure to air pollution, chemical fumes, or dust.  Things that give you an allergic reaction (allergens).  Not taking your usual COPD medicines as directed.  Underlying medical problems, such as congestive heart failure or infections not involving the lungs. In many cases, the cause (trigger) of this condition is not known. What increases the risk? The following factors may make you more likely to develop this condition:  Smoking cigarettes.  Old age.  Frequent prior COPD exacerbations. What are the signs or symptoms? Symptoms of this condition include:  Increased coughing.  Increased production of mucus from your lungs (sputum).  Increased wheezing.  Increased shortness of breath.  Rapid  or labored breathing.  Chest tightness.  Less energy than usual.  Sleep disruption from symptoms.  Confusion or increased sleepiness. Often these symptoms happen or get worse even with the use of medicines. How is this diagnosed? This condition is diagnosed based on:  Your medical history.  A physical exam. You may also have tests, including:  A chest X-ray.  Blood tests.  Lung (pulmonary) function tests. How is this treated? Treatment for this condition depends on the severity and cause of the symptoms. You may need to be admitted to a hospital for treatment. Some of the treatments commonly used to treat COPD exacerbations are:  Antibiotic medicines. These may be used for severe exacerbations caused by a lung infection, such as pneumonia.  Bronchodilators. These are inhaled medicines that expand the air passages and allow increased airflow.  Steroid medicines. These act to reduce inflammation in the airways. They may be given with an inhaler, taken by mouth, or given through an IV tube inserted into one of your veins.  Supplemental oxygen therapy.  Airway clearing techniques, such as noninvasive ventilation (NIV) and positive expiratory pressure (PEP). These provide respiratory support through a mask or other noninvasive device. An example of this would be using a continuous positive airway pressure (CPAP) machine to improve delivery of oxygen into your lungs. Follow these instructions at home: Medicines  Take  over-the-counter and prescription medicines only as told by your health care provider. It is important to use correct technique with inhaled medicines.  If you were prescribed an antibiotic medicine or oral steroid, take it as told by your health care provider. Do not stop taking the medicine even if you start to feel better. Lifestyle  Eat a healthy diet.  Exercise regularly.  Get plenty of sleep.  Avoid exposure to all substances that irritate the airway,  especially to tobacco smoke.  Wash your hands often with soap and water to reduce the risk of infection. If soap and water are not available, use hand sanitizer.  During flu season, avoid enclosed spaces that are crowded with people. General instructions  Drink enough fluid to keep your urine clear or pale yellow (unless you have a medical condition that requires fluid restriction).  Use a cool mist vaporizer. This humidifies the air and makes it easier for you to clear your chest when you cough.  If you have a home nebulizer and oxygen, continue to use them as told by your health care provider.  Keep all follow-up visits as told by your health care provider. This is important. How is this prevented?  Stay up-to-date on pneumococcal and influenza (flu) vaccines. A flu shot is recommended every year to help prevent exacerbations.  Do not use any products that contain nicotine or tobacco, such as cigarettes and e-cigarettes. Quitting smoking is very important in preventing COPD from getting worse and in preventing exacerbations from happening as often. If you need help quitting, ask your health care provider.  Follow all instructions for pulmonary rehabilitation after a recent exacerbation. This can help prevent future exacerbations.  Work with your health care provider to develop and follow an action plan. This tells you what steps to take when you experience certain symptoms. Contact a health care provider if:  You have a worsening of your regular COPD symptoms. Get help right away if:  You have worsening shortness of breath, even when resting.  You have trouble talking.  You have severe chest pain.  You cough up blood.  You have a fever.  You have weakness, vomit repeatedly, or faint.  You feel confused.  You are not able to sleep because of your symptoms.  You have trouble doing daily activities. Summary  COPD exacerbations are episodes when breathing symptoms become  much worse and require extra treatment above your normal treatment.  Exacerbations can be severe and even life threatening. Frequent COPD exacerbations can cause further damage to your lungs.  COPD exacerbations are usually triggered by infections such as the flu, colds, and even pneumonia.  Treatment for this condition depends on the severity and cause of the symptoms. You may need to be admitted to a hospital for treatment.  Quitting smoking is very important to prevent COPD from getting worse and to prevent exacerbations from happening as often. This information is not intended to replace advice given to you by your health care provider. Make sure you discuss any questions you have with your health care provider. Document Revised: 06/05/2017 Document Reviewed: 07/28/2016 Elsevier Patient Education  2021 [place] for Chronic Obstructive Pulmonary Disease Chronic obstructive pulmonary disease (COPD) causes symptoms such as shortness of breath, coughing, and chest discomfort. These symptoms can make it difficult to eat enough to maintain a healthy weight. Generally, people with COPD should eat a diet that is high in calories, protein, and other nutrients to maintain body weight  and to keep the lungs as healthy as possible. Depending on the medicines you take and other health conditions you may have, your health care provider may give you additional recommendations on what to eat or avoid. Talk with your health care provider about your goals for body weight, and work with a dietitian to develop an eating plan that is right for you. What are tips for following this plan? Reading food labels  Avoid foods with more than 300 milligrams (mg) of salt (sodium) per serving.  Choose foods that contain at least 4 grams (g) of fiber per serving. Try to eat 20-30 g of fiber each day.  Choose foods that are high in calories and protein, such as nuts, beans, yogurt, and cheese.    Shopping  Do not buy foods labeled as diet, low-calorie, or low-fat.  If you are able to eat dairy products: ? Avoid low-fat or skim milk. ? Buy dairy products that have at least 2% fat.  Buy nutritional supplement drinks.  Buy grains and prepared foods labeled as enriched or fortified.  Consider buying low-sodium, pre-made foods to conserve energy for eating. Cooking  Add dry milk or protein powder to smoothies.  Cook with healthy fats, such as olive oil, canola oil, sunflower oil, and grapeseed oil.  Add oil, butter, cream cheese, or nut butters to foods to increase fat and calories.  To make foods easier to chew and swallow: ? Cook vegetables, pasta, and rice until soft. ? Cut or grind meat into very small pieces. ? Dip breads in liquid. Meal planning  Eat when you feel hungry.  Eat 5-6 small meals throughout the day.  Drink 6-8 glasses of water each day.  Do not drink liquids with meals. Drink liquids at the end of the meal to avoid feeling full too quickly.  Eat a variety of fruits and vegetables every day.  Ask for assistance from family or friends with planning and preparing meals as needed.  Avoid foods that cause you to feel bloated, such as carbonated drinks, fried foods, beans, broccoli, cabbage, and apples.  For older adults, ask your local agency on aging whether you are eligible for meal assistance programs, such as Meals on Wheels.   Lifestyle  Do not smoke.  Eat slowly. Take small bites and chew food well before swallowing.  Do not overeat. This may make it more difficult to breathe after eating.  Sit up while eating.  If needed, continue to use supplemental oxygen while eating.  Rest or relax for 30 minutes before and after eating.  Monitor your weight as told by your health care provider.  Exercise as told by your health care provider.   What foods should I eat? Fruits All fresh, dried, canned, or frozen fruits that do not cause  gas. Vegetables All fresh, canned (no salt added), or frozen vegetables that do not cause gas. Grains Whole-grain bread. Enriched whole-grain pasta. Fortified whole-grain cereals. Fortified rice. Quinoa. Meats and other proteins Lean meat. Poultry. Fish. Dried beans. Unsalted nuts. Tofu. Eggs. Nut butters. Dairy Whole or 2% milk. Cheese. Yogurt. Fats and oils Olive oil. Canola oil. Butter. Margarine. Beverages Water. Vegetable juice (no salt added). Decaffeinated coffee. Decaffeinated or herbal tea. Seasonings and condiments Fresh or dried herbs. Low-salt or salt-free seasonings. Low-sodium soy sauce. The items listed above may not be a complete list of foods and beverages you can eat. Contact a dietitian for more information. What foods should I avoid? Fruits Fruits that cause gas,  such as apples or melon. Vegetables Vegetables that cause gas, such as broccoli, Brussels sprouts, cabbage, cauliflower, and onions. Canned vegetables with added salt. Meats and other proteins Fried meat. Salt-cured meat. Processed meat. Dairy Fat-free or low-fat milk, yogurt, or cheese. Processed cheese. Beverages Carbonated drinks. Caffeinated drinks, such as coffee, tea, and soft drinks. Juice. Alcohol. Vegetable juice with added salt. Seasonings and condiments Salt. Seasoning mixes with salt. Soy sauce. Angie Fava. Other foods Clear soup or broth. Fried foods. Prepared frozen meals. The items listed above may not be a complete list of foods and beverages you should avoid. Contact a dietitian for more information. Summary  COPD symptoms can make it difficult to eat enough to maintain a healthy weight.  A COPD eating plan can help you maintain your body weight and keep your lungs as healthy as possible.  Eat a diet that is high in calories, protein, and other nutrients. Read labels to make sure that you are getting the right nutrients. Cook foods to make them easier to chew and swallow.  Eat 5-6  small meals throughout the day, and avoid foods that cause gas or make you feel bloated. This information is not intended to replace advice given to you by your health care provider. Make sure you discuss any questions you have with your health care provider. Document Revised: 05/01/2020 Document Reviewed: 05/01/2020 Elsevier Patient Education  [employer].   171, P1-P2. Online version updated July 2020.Retrieved from DirectoryTags.[email or web address].pdf">  Hypoxemia  Hypoxemia happens when the blood does not have enough oxygen in it. Every part of the body needs oxygen to work well. Oxygen enters the lungs when a person breathes in and then it travels to all parts of the body through the blood. Hypoxemia can develop suddenly or slowly and can be mild to severe. What are the causes? Causes of hypoxemia may include:  Lung conditions. These may include: ? Long-term (chronic) lung disease, such as:  Asthma.  Chronic obstructive pulmonary disease (COPD).  Interstitial lung disease. ? Problems that affect breathing at night, such as sleep apnea. ? Fluid buildup in the lungs. ? Lung infection (pneumonia). ? Lung or throat cancer. ? A collapsed lung.  Heart or blood vessel (vascular) conditions, such as: ? A blood clot in the lungs (pulmonary embolus). ? Certain types of heart disease.  Other causes may include: ? Certain diseases that affect nerves or muscles. ? Slow or shallow breathing due to being very overweight (obesity hypoventilation). ? High altitudes, as there is less oxygen in the air. ? Toxic chemicals, smoke, and gases. What are the signs or symptoms? In some cases, there may be no symptoms of this condition. If you do have symptoms, they may include:  Shortness of breath.  Breathing that is fast, noisy, or shallow.  Bluish color of the skin, lips, or nail beds.  A fast heartbeat.  Feeling tired or  sleepy.  Feeling confused or agitated. If hypoxemia develops quickly, it is likely you will suddenly have trouble breathing. If hypoxemia develops slowly over months or years, you may not notice any symptoms. How is this diagnosed? This condition is diagnosed by:  A physical exam.  A blood test that measures the amount of oxygen in your blood.  A test that measures the percentage of oxygen in your blood (pulse oximetry). This is done by placing a sensor on your finger, toe, or earlobe. How is this treated? Treatment for this condition depends on the cause or the severity of your  hypoxemia.  You will likely be treated with oxygen therapy to restore your blood oxygen level.  You may need oxygen therapy for a short time, such as weeks or months, or you may need it for the rest of your life.  You may be asked to lay on your stomach (prone). This may help bring your oxygen level up or help you feel less short of breath. Your health care provider may also recommend other therapies to treat the underlying cause of your hypoxemia. Follow these instructions at home:  Take over-the-counter and prescription medicines only as told by your health care provider.  If you are on oxygen therapy, follow oxygen safety precautions as directed by your health care provider. Precautions may include: ? Always have a backup supply of oxygen. ? Do not let anyone smoke or have a fire near your oxygen supply. ? Handle oxygen tanks carefully as told by your health care provider.  Do not use any products that contain nicotine or tobacco, such as cigarettes, e-cigarettes, and chewing tobacco. If you need help quitting, ask your health care provider. Stay away from people who smoke.  Keep all follow-up visits as told by your health care provider. This is important.   Contact a health care provider if:  You have any concerns about your oxygen therapy.  You have trouble breathing, even while wearing an oxygen  supply.  You become short of breath when you exercise.  You are still tired or have a headache when you wake up. Get help right away if:  Your shortness of breath gets worse, especially with normal activity or only a little bit of activity.  Your skin, lips, or nail beds are a bluish color.  You become confused or you cannot think properly.  You have chest pain.  You have a fever. These symptoms may represent a serious problem that is an emergency. Do not wait to see if the symptoms will go away. Get medical help right away. Call your local emergency services (911 in the [place] Do not drive yourself to the hospital. Summary  Hypoxemia occurs when the blood does not contain enough oxygen.  Hypoxemia may or may not cause symptoms. Often, the main symptom is shortness of breath..  Depending on the cause of your hypoxemia, you may need oxygen therapy for a short time, such as weeks or months, or you may need it for the rest of your life.  If you are on oxygen therapy, follow oxygen safety precautions as directed by your health care provider. This information is not intended to replace advice given to you by your health care provider. Make sure you discuss any questions you have with your health care provider. Document Revised: 07/14/2019 Document Reviewed: 07/14/2019 Elsevier Patient Education  2021 [employer].

## 2020-09-27 NOTE — Discharge Summary (Addendum)
Discharge Summary  Beth Odonnell DXA:[id number] DOB: [birth year]  PCP: Donnie Coffin, MD  Admit date: 09/24/2020 Discharge date: 09/27/2020  Time spent: 35 minutes.  Recommendations for Outpatient Follow-up:  1. Follow-up with your pulmonologist within a week. 2. Follow-up with your cardiologist within 7 days for your paroxysmal A. fib. 3. Follow-up with your primary care provider in 1 to 2 weeks. 4. Take your medications as prescribed. 5. Completely abstain from tobacco use or from secondhand smoking.  Discharge Diagnoses:  Active Hospital Problems   Diagnosis Date Noted  . COPD exacerbation ([place]) 09/25/2020  . Diabetes mellitus without complication ([place]) 20/94/7096  . Essential hypertension 09/11/2019  . Acute respiratory failure with hypoxia ([place]) 09/11/2019  . Hypokalemia 07/27/2016    Resolved Hospital Problems  No resolved problems to display.    Discharge Condition: Stable.  Diet recommendation: Heart healthy carb modified diet, DASH diet.  Vitals:   09/27/20 0830 09/27/20 1228  BP: (!) 158/90 134/84  Pulse: 98 (!) 104  Resp: 18 20  Temp: 98.4 F (36.9 C) 98.7 F (37.1 C)  SpO2: 90% 93%    History of present illness:  Beth Odonnell is a 70 year old female with PMH COPD, tobacco use disorder, nonobstructive CAD, paroxysmal A. fib not on oral anticoagulation, hypertension, type 2 diabetes who presents to [facility] ED due to worsening shortness of breath. She was seen in urgent care prior to admission as well. Due to her ongoing shortness of breath she presented to the ED for further evaluation. She endorsed ongoing tobacco use recently but has not smoked in a couple days due to feeling poorly. She was admitted for work-up and evaluation. CXR showed relatively clear lungs. Her work-up was consistent with COPD exacerbation and new hypoxemia, 88% on room air.  She was started on steroids and breathing treatments.  Doxycycline added for bronchitis.  Hospital course  complicated by uncontrolled hypertension for which she was started on Norvasc 10 mg daily.  09/27/20:  Patient was seen and examined at her bedside.  There were no acute events overnight.  She states she feels better.  Dyspnea has improved.  She completed home oxygen evaluation, does not require oxygen supplementation, oxygen supplementation in the mid 90s on room air while ambulating.   Hospital Course:  Principal Problem:   COPD exacerbation ([place]) Active Problems:   Hypokalemia   Essential hypertension   Acute respiratory failure with hypoxia (HCC)   Diabetes mellitus without complication ([place])  Improving acute COPD exacerbation (HCC)/bronchitis -Patient presents with increased work of breathing, speaking in short sentences hypoxic to the 80s on room air with EMS requiring 2 L to maintain sats in the low 90s.COVID and flu negative.  Chest x-ray no acute disease -She received steroids and bronchodilators. -Doxycycline was added 100 mg twice daily x7 -Advised to follow-up with her pulmonologist posthospitalization.  Patient is receptive.  Resolved acute hypoxic respiratory failure secondary to acute COPD exacerbation/bronchitis Not on oxygen supplementation at baseline. Initially requiring 2 L to maintain O2 saturation greater than 92%. Passed her home oxygen evaluation on 09/27/2020, does not require oxygen supplementation at this time with O2 saturation in the mid 90s on room air while ambulating.  Repleted: Hypokalemia Serum potassium 3.4 Repleted orally. Follow-up with your PCP in 1 to 2 weeks  Essential hypertension BP is currently at goal 134/84 Was started on Norvasc 10 mg daily, continue Follow-up with your PCP in 1 to 2 weeks.  Type 2 diabetes with hyperglycemia A1c 8.8 on 09/25/2020 Start Metformin  850 mg twice daily.  Coronary artery disease/hyperlipidemia -No complaints of chest pain, troponin normal and EKG nonacute -We will start patient on aspirin and  Lipitor. -Follow-up with your cardiologist in 1 week.  Paroxysmal A. fib not on oral anticoagulation In sinus rhythm on twelve-lead EKG done on 09/24/2020. She has a CHA2DS2-VASc score of at least 4 We will defer anticoagulation to her cardiologist. Follow-up with your cardiologist in 7 days  Tobacco use disorder Tobacco cessation counseling done at bedside. Follow-up with your primary care provider in 1 to 2 weeks.    Code Status:full code  Consults called:none   Discharge Exam: BP 134/84   Pulse (!) 104   Temp 98.7 F (37.1 C) (Oral)   Resp 20   Ht 5\' 11"  (1.803 m)   Wt 105.2 kg   SpO2 93%   BMI 32.36 kg/m  . General: 70 y.o. year-old female well developed well nourished in no acute distress.  Alert and oriented x3. . Cardiovascular: Regular rate and rhythm with no rubs or gallops.  No thyromegaly or JVD noted.   Marland Kitchen Respiratory: Clear to auscultation with no wheezes or rales. Good inspiratory effort. . Abdomen: Soft nontender nondistended with normal bowel sounds x4 quadrants. . Musculoskeletal: No lower extremity edema. 2/4 pulses in all 4 extremities. . Skin: No ulcerative lesions noted or rashes. . Psychiatry: Mood is appropriate for condition and setting  Discharge Instructions You were cared for by a hospitalist during your hospital stay. If you have any questions about your discharge medications or the care you received while you were in the hospital after you are discharged, you can call the unit and asked to speak with the hospitalist on call if the hospitalist that took care of you is not available. Once you are discharged, your primary care physician will handle any further medical issues. Please note that NO REFILLS for any discharge medications will be authorized once you are discharged, as it is imperative that you return to your primary care physician (or establish a relationship with a primary care physician if you do not have one) for your aftercare  needs so that they can reassess your need for medications and monitor your lab values.   Allergies as of 09/27/2020      Reactions   Penicillins Other (See Comments)   Patient passed out. Has patient had a PCN reaction causing immediate rash, facial/tongue/throat swelling, SOB or lightheadedness with hypotension: No Has patient had a PCN reaction causing severe rash involving mucus membranes or skin necrosis: No Has patient had a PCN reaction that required hospitalization No Has patient had a PCN reaction occurring within the last 10 years: No If all of the above answers are "NO", then may proceed with Cephalosporin use. Went to hospital   Cat Hair Extract    hives      Medication List    TAKE these medications   albuterol 108 (90 Base) MCG/ACT inhaler Commonly known as: VENTOLIN HFA Inhale 1-2 puffs into the lungs every 6 (six) hours as needed for wheezing or shortness of breath. What changed: when to take this   albuterol (2.5 MG/3ML) 0.083% nebulizer solution Commonly known as: PROVENTIL Take 3 mLs (2.5 mg total) by nebulization every 2 (two) hours as needed for wheezing or shortness of breath. What changed: You were already taking a medication with the same name, and this prescription was added. Make sure you understand how and when to take each.   amLODipine 10 MG tablet Commonly  known as: NORVASC Take 1 tablet (10 mg total) by mouth daily. Start taking on: September 28, 2020   aspirin 81 MG EC tablet Take 1 tablet (81 mg total) by mouth daily. Swallow whole.   atorvastatin 20 MG tablet Commonly known as: LIPITOR Take 1 tablet (20 mg total) by mouth daily.   doxycycline 100 MG tablet Commonly known as: VIBRA-TABS Take 1 tablet (100 mg total) by mouth every 12 (twelve) hours for 7 days.   metFORMIN 850 MG tablet Commonly known as: GLUCOPHAGE Take 1 tablet (850 mg total) by mouth 2 (two) times daily with a meal.   predniSONE 20 MG tablet Commonly known as:  DELTASONE Take 2 tablets (40 mg total) by mouth daily with breakfast for 3 days. Start taking on: September 28, 2020   Primatene Mist 0.125 MG/ACT Aero Generic drug: EPINEPHrine Inhale 1-2 puffs into the lungs 4 (four) times daily as needed (asthma symptoms).            Durable Medical Equipment  (From admission, onward)         Start     Ordered   09/27/20 1237  For home use only DME Nebulizer machine  Once       Question Answer Comment  Patient needs a nebulizer to treat with the following condition Acute exacerbation of chronic obstructive pulmonary disease (COPD) ([place])   Length of Need 6 Months      09/27/20 1236         Allergies  Allergen Reactions  . Penicillins Other (See Comments)    Patient passed out.  Has patient had a PCN reaction causing immediate rash, facial/tongue/throat swelling, SOB or lightheadedness with hypotension: No Has patient had a PCN reaction causing severe rash involving mucus membranes or skin necrosis: No Has patient had a PCN reaction that required hospitalization No Has patient had a PCN reaction occurring within the last 10 years: No If all of the above answers are "NO", then may proceed with Cephalosporin use. Went to hospital  . Cat Hair Extract     hives    Follow-up Information    Erby Pian, MD. Call in 1 day(s).   Specialty: Specialist Why: Please call for a post hospital follow-up appointment. Contact information: [place] [address] [phone]        Yolonda Kida, MD. Call in 1 day(s).   Specialties: Cardiology, Internal Medicine Why: Please call for a post hospital follow-up appointment. Contact information: [place] [address] [phone]                The results of significant diagnostics from this hospitalization (including imaging, microbiology, ancillary and laboratory) are listed below for reference.    Significant Diagnostic  Studies: DG Chest Port 1 View  Result Date: 09/25/2020 CLINICAL DATA:  70 year old female with shortness of breath. EXAM: PORTABLE CHEST 1 VIEW COMPARISON:  Chest radiograph dated 06/15/2020. FINDINGS: Mild interstitial prominence, likely chronic. No focal consolidation, pleural effusion or pneumothorax. The cardiac silhouette is within limits. No acute osseous pathology. IMPRESSION: No active disease. Electronically Signed   By: Anner Crete M.D.   On: 09/25/2020 00:39    Microbiology: Recent Results (from the past 240 hour(s))  Resp Panel by RT-PCR (Flu A&B, Covid) Nasopharyngeal Swab     Status: None   Collection Time: 09/24/20 11:42 PM   Specimen: Nasopharyngeal Swab; Nasopharyngeal(NP) swabs in vial transport medium  Result Value Ref Range Status  SARS Coronavirus 2 by RT PCR NEGATIVE NEGATIVE Final    Comment: (NOTE) SARS-CoV-2 target nucleic acids are NOT DETECTED.  The SARS-CoV-2 RNA is generally detectable in upper respiratory specimens during the acute phase of infection. The lowest concentration of SARS-CoV-2 viral copies this assay can detect is 138 copies/mL. A negative result does not preclude SARS-Cov-2 infection and should not be used as the sole basis for treatment or other patient management decisions. A negative result may occur with  improper specimen collection/handling, submission of specimen other than nasopharyngeal swab, presence of viral mutation(s) within the areas targeted by this assay, and inadequate number of viral copies(<138 copies/mL). A negative result must be combined with clinical observations, patient history, and epidemiological information. The expected result is Negative.  Fact Sheet for Patients:  EntrepreneurPulse.com.au  Fact Sheet for Healthcare Providers:  IncredibleEmployment.be  This test is no t yet approved or cleared by the [place] FDA and  has been authorized for detection and/or  diagnosis of SARS-CoV-2 by FDA under an Emergency Use Authorization (EUA). This EUA will remain  in effect (meaning this test can be used) for the duration of the COVID-19 declaration under Section 564(b)(1) of the Act, 21 U.S.C.section 360bbb-3(b)(1), unless the authorization is terminated  or revoked sooner.       Influenza A by PCR NEGATIVE NEGATIVE Final   Influenza B by PCR NEGATIVE NEGATIVE Final    Comment: (NOTE) The Xpert Xpress SARS-CoV-2/FLU/RSV plus assay is intended as an aid in the diagnosis of influenza from Nasopharyngeal swab specimens and should not be used as a sole basis for treatment. Nasal washings and aspirates are unacceptable for Xpert Xpress SARS-CoV-2/FLU/RSV testing.  Fact Sheet for Patients: EntrepreneurPulse.com.au  Fact Sheet for Healthcare Providers: IncredibleEmployment.be  This test is not yet approved or cleared by the [place] FDA and has been authorized for detection and/or diagnosis of SARS-CoV-2 by FDA under an Emergency Use Authorization (EUA). This EUA will remain in effect (meaning this test can be used) for the duration of the COVID-19 declaration under Section 564(b)(1) of the Act, 21 U.S.C. section 360bbb-3(b)(1), unless the authorization is terminated or revoked.  Performed at [facility], [address] [place], [place] [address]      Labs: Basic Metabolic Panel: Recent Labs  Lab 09/24/20 2344 09/26/20 0548 09/27/20 0544  NA 139 140 142  K 3.2* 4.1 3.4*  CL 104 107 105  CO2 26 24 31   GLUCOSE 188* 259* 152*  BUN 14 26* 13  CREATININE 0.64 0.71 0.64  CALCIUM 8.9 9.1 9.0  MG  --  2.0 2.0   Liver Function Tests: Recent Labs  Lab 09/24/20 2344  AST 26  ALT 26  ALKPHOS 85  BILITOT 0.6  PROT 8.1  ALBUMIN 4.3   No results for input(s): LIPASE, AMYLASE in the last 168 hours. No results for input(s): AMMONIA in the last 168 hours. CBC: Recent Labs  Lab  09/24/20 2344 09/26/20 0548 09/27/20 0544  WBC 7.2 11.8* 10.8*  NEUTROABS 3.3 9.7* 6.9  HGB 13.9 12.6 13.1  HCT 43.5 38.9 40.8  MCV 87.7 87.2 86.3  PLT 286 258 267   Cardiac Enzymes: No results for input(s): CKTOTAL, CKMB, CKMBINDEX, TROPONINI in the last 168 hours. BNP: BNP (last 3 results) Recent Labs    09/24/20 2344  BNP 10.3    ProBNP (last 3 results) No results for input(s): PROBNP in the last 8760 hours.  CBG: Recent Labs  Lab 09/26/20 1230 09/26/20 1604  09/26/20 2039 09/27/20 0757 09/27/20 1210  GLUCAP 190* 211* 170* 137* 227*       Signed:  Kayleen Memos, MD [facility] 09/27/2020, 1:03 PM

## 2020-09-27 NOTE — Progress Notes (Signed)
Patient ambulated independently in hall, laps x2, with continuous pulse Ox, accompanied by RN.  Patient remained above 92% SpO2 in Room Air maintaining oxygenation at 95% for the most part.  Patient was at 92% for a couple of coughing spells that did not required patient to stop.

## 2022-04-27 IMAGING — DX DG CHEST 1V PORT
2 series · 2 of 2 positions shown · non-contrast
Comparison: Chest radiograph dated 06/15/2020.

CLINICAL DATA: 69-year-old female with shortness of breath.

EXAM:
PORTABLE CHEST 1 VIEW

[chest ap (1 of 2)]
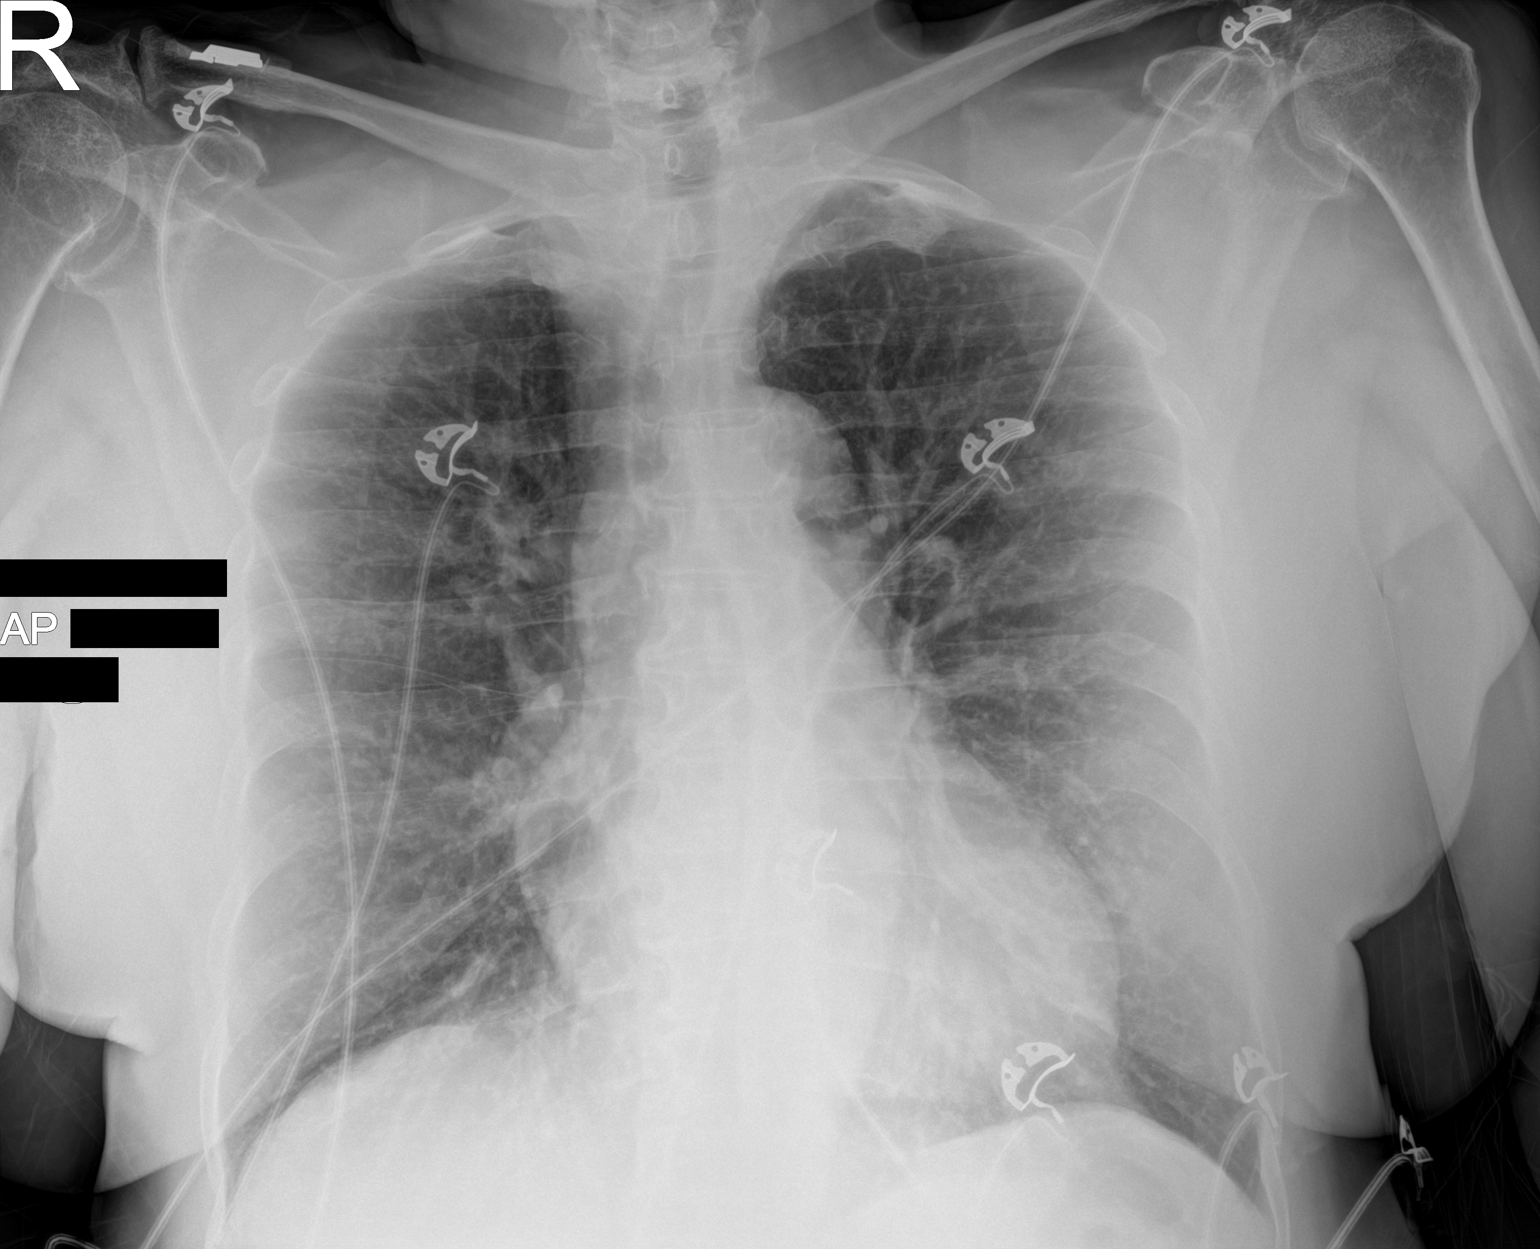

[chest ap (2 of 2)]
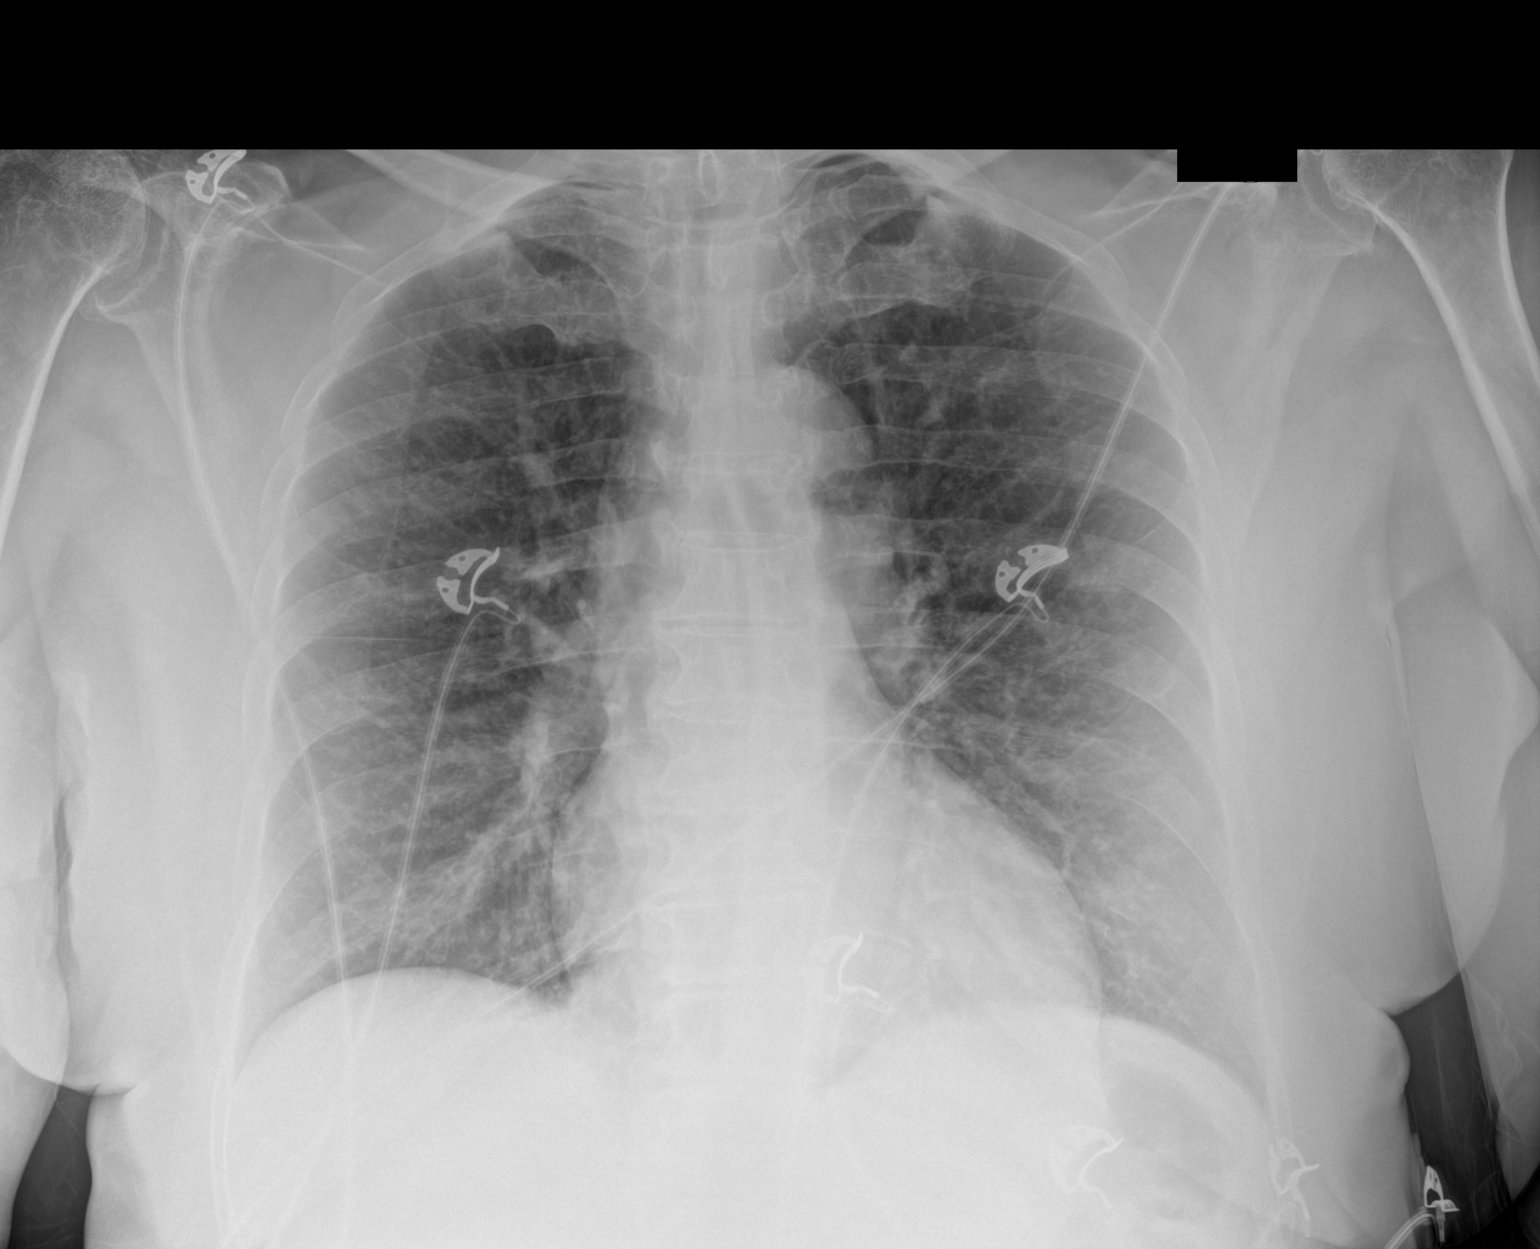

[2 of 2 positions shown; findings below may reference images not displayed]

FINDINGS: Mild interstitial prominence, likely chronic. No focal
consolidation, pleural effusion or pneumothorax. The cardiac
silhouette is within limits. No acute osseous pathology.
IMPRESSION: No active disease.

## 2022-06-10 ENCOUNTER — Other Ambulatory Visit: Payer: Self-pay

## 2022-06-10 ENCOUNTER — Emergency Department: Payer: BC Managed Care – PPO

## 2022-06-10 ENCOUNTER — Encounter: Payer: Self-pay | Admitting: Emergency Medicine

## 2022-06-10 ENCOUNTER — Emergency Department
Admission: EM | Admit: 2022-06-10 | Discharge: 2022-06-10 | Disposition: A | Discharge status: Home / Self Care | Payer: BC Managed Care – PPO | Attending: Emergency Medicine | Admitting: Emergency Medicine

## 2022-06-10 DIAGNOSIS — I10 Essential (primary) hypertension: Secondary | ICD-10-CM | POA: Diagnosis not present

## 2022-06-10 DIAGNOSIS — E119 Type 2 diabetes mellitus without complications: Secondary | ICD-10-CM | POA: Insufficient documentation

## 2022-06-10 DIAGNOSIS — R059 Cough, unspecified: Secondary | ICD-10-CM | POA: Diagnosis present

## 2022-06-10 DIAGNOSIS — J441 Chronic obstructive pulmonary disease with (acute) exacerbation: Secondary | ICD-10-CM | POA: Insufficient documentation

## 2022-06-10 HISTORY — DX: Chronic obstructive pulmonary disease, unspecified: J44.9

## 2022-06-10 MED ORDER — PREDNISONE 20 MG PO TABS
50.0000 mg | ORAL_TABLET | Freq: Once | ORAL | Status: AC
  Administered 2022-06-10: 50 mg via ORAL
  Filled 2022-06-10: qty 3

## 2022-06-10 MED ORDER — IPRATROPIUM-ALBUTEROL 0.5-2.5 (3) MG/3ML IN SOLN
3.0000 mL | Freq: Once | RESPIRATORY_TRACT | Status: AC
  Administered 2022-06-10: 3 mL via RESPIRATORY_TRACT
  Filled 2022-06-10: qty 3

## 2022-06-10 MED ORDER — PREDNISONE 50 MG PO TABS: ORAL_TABLET | ORAL | 0 refills | Status: DC

## 2022-06-10 NOTE — ED Provider Triage Note (Signed)
Emergency Medicine Provider Triage Evaluation Note  Beth Odonnell , a 71 y.o. female  was evaluated in triage.  Pt complains of persistent cough x 1 week after some lemonade "went down the wrong way." Prior to that, she had a cold that was treated with albuterol and antibiotics. This had resolved before the lemonade incident. No fever or chest pain.  Physical Exam  Ht '5\' 11"'$  (1.803 m)   Wt 85.3 kg   BMI 26.22 kg/m  Gen:   Awake, no distress   Resp:  Normal effort  MSK:   Moves extremities without difficulty  Other:    Medical Decision Making  Medically screening exam initiated at 5:50 PM.  Appropriate orders placed.  Beth Odonnell was informed that the remainder of the evaluation will be completed by another provider, this initial triage assessment does not replace that evaluation, and the importance of remaining in the ED until their evaluation is complete.    Beth Dike, FNP 06/10/22 1753

## 2022-06-10 NOTE — Discharge Instructions (Addendum)
You may continue your nebulizer at home.  You may also take the prednisone as prescribed, but remember that you need to pay very close attention to your blood sugars and taking this medication.  Please return for any new, worsening, or change in symptoms or other concerns.  Please follow-up with your outpatient provider this week.  It was a pleasure caring for you today.

## 2022-06-10 NOTE — ED Provider Notes (Signed)
[facility] Provider Note    Event Date/Time   First MD Initiated Contact with Patient 06/10/22 1807     (approximate)   History   Cough   HPI  Beth Odonnell is a 71 y.o. female with a past medical history of diabetes, COPD, hypertension who presents today for evaluation of cough for the past week.  Patient denies chest pain or fever.  She reports that she has had cough for the past couple of weeks.  She has been using her inhalers as prescribed.  She denies having any fevers or chills.  She reports that this feels the same as her asthma/COPD.  She reports that she is still a smoker and her baseline oxygen level is about 91% on room air.  She does not use home O2.  Patient Active Problem List   Diagnosis Date Noted   Diabetes mellitus without complication ([place]) 35/57/3220   COPD exacerbation ([place]) 09/25/2020   Type 2 diabetes mellitus with hyperglycemia ([place]) 09/13/2019   Essential hypertension 09/11/2019   Acute respiratory failure with hypoxia ([place]) 09/11/2019   Nonsustained ventricular tachycardia ([place]) 07/27/2016   Chest pain 07/27/2016   Hypokalemia 07/27/2016   Near syncope 07/27/2016   NSVT (nonsustained ventricular tachycardia) ([place]) 07/27/2016          Physical Exam   Triage Vital Signs: ED Triage Vitals  Enc Vitals Group     BP --      Pulse --      Resp --      Temp --      Temp src --      SpO2 --      Weight 06/10/22 1747 188 lb (85.3 kg)     Height 06/10/22 1747 '5\' 11"'$  (1.803 m)     Head Circumference --      Peak Flow --      Pain Score 06/10/22 1746 0     Pain Loc --      Pain Edu? --      Excl. in [place]? --     Most recent vital signs: Vitals:   06/10/22 1824 06/10/22 1856  BP:    Pulse:    Resp:    Temp: 98.7 F (37.1 C)   SpO2:  98%    Physical Exam Vitals and nursing note reviewed.  Constitutional:      General: Awake and alert. No acute distress.    Appearance: Normal appearance. The patient is normal  weight.  HENT:     Head: Normocephalic and atraumatic.     Mouth: Mucous membranes are moist.  Eyes:     General: PERRL. Normal EOMs        Right eye: No discharge.        Left eye: No discharge.     Conjunctiva/sclera: Conjunctivae normal.  Cardiovascular:     Rate and Rhythm: Normal rate and regular rhythm.     Pulses: Normal pulses.  Pulmonary:     Effort: Pulmonary effort is normal. No respiratory distress.  Able to speak easily in complete sentences    Breath sounds: Expiratory wheezes in bilateral lung fields Abdominal:     Abdomen is soft. There is no abdominal tenderness. No rebound or guarding. No distention. Musculoskeletal:        General: No swelling. Normal range of motion.     Cervical back: Normal range of motion and neck supple.  No lower extremity edema or calf tenderness Skin:    General:  Skin is warm and dry.     Capillary Refill: Capillary refill takes less than 2 seconds.     Findings: No rash.  Neurological:     Mental Status: The patient is awake and alert.      ED Results / Procedures / Treatments   Labs (all labs ordered are listed, but only abnormal results are displayed) Labs Reviewed - No data to display   EKG     RADIOLOGY I independently reviewed and interpreted imaging and agree with radiologists findings.     PROCEDURES:  Critical Care performed:   Procedures   MEDICATIONS ORDERED IN ED: Medications  ipratropium-albuterol (DUONEB) 0.5-2.5 (3) MG/3ML nebulizer solution 3 mL (3 mLs Nebulization Given 06/10/22 1838)  ipratropium-albuterol (DUONEB) 0.5-2.5 (3) MG/3ML nebulizer solution 3 mL (3 mLs Nebulization Given 06/10/22 1834)  ipratropium-albuterol (DUONEB) 0.5-2.5 (3) MG/3ML nebulizer solution 3 mL (3 mLs Nebulization Given 06/10/22 1829)  predniSONE (DELTASONE) tablet 50 mg (50 mg Oral Given 06/10/22 1829)     IMPRESSION / MDM / ASSESSMENT AND PLAN / ED COURSE  I reviewed the triage vital signs and the nursing  notes.  Differential diagnosis includes, but is not limited to, COPD exacerbation, pneumonia, bronchitis, URI.  Patient is awake and alert, speaking easily in complete sentences.  Her oxygenation is approximately 91% on room air which she says is her baseline for her.  She was placed on the cardiac monitor and continuous pulse ox.  She has expiratory wheezes in all lung fields though she is able to speak easily in complete sentences.  Chest x-ray does not reveal any focal consolidation.  She was treated with 3 nebs and prednisone with significant improvement of her symptoms and her oxygenation.  At the time of discharge she was at 98% on room air.  I reviewed the patient's chart.  Patient was admitted to the hospitalist service in March of this year for COPD exacerbation at which time she had an oxygen saturation in the 80s and required oxygen.  She is not currently requiring oxygen.  There is no accessory muscle use.  No chest pain to suggest acute coronary syndrome.  EKG is nonischemic.  No pleurisy, lower extremity edema or tenderness, tachycardia, exogenous hormone use, periods of immobilization, or recent hospitalization/travel to suggest PE as a source of her shortness of breath today.  I suspect COPD exacerbation in the context of her recent/current URI.  I gave her the option of prednisone burst given her history of diabetes, she would like to attempt this medication, though she knows that she needs to pay very close attention to her blood sugars.  We discussed return precautions and the importance of close outpatient follow-up.  Also again discussed the importance of smoking cessation.  Patient understands and agrees with plan.  She was discharged in stable condition.   Patient's presentation is most consistent with acute complicated illness / injury requiring diagnostic workup.   Clinical Course as of 06/10/22 1907  Tue Jun 10, 2022  1853 On my evaluation, patient reports that she feel  significantly improved.  Her O2 saturation is now 95% on room air. [JP]  1540 Patient now at 98% on RA and would like to be discharged [JP]  1906 Patient 100% on RA on the pulse oximeter monitor, ready for discharge [JP]    Clinical Course User Index [JP] Manning Luna, Clarnce Flock, PA-C     FINAL CLINICAL IMPRESSION(S) / ED DIAGNOSES   Final diagnoses:  COPD exacerbation ([place])  Rx / DC Orders   ED Discharge Orders          Ordered    predniSONE (DELTASONE) 50 MG tablet        06/10/22 1857             Note:  This document was prepared using Dragon voice recognition software and may include unintentional dictation errors.   Emeline Gins 06/10/22 1907    Rada Hay, MD 06/10/22 2043

## 2022-06-10 NOTE — ED Triage Notes (Signed)
Says copd and had cold for a long time, and it got better.  Then she aspirated some lemonade and now it is worse.  Coughing in triage.

## 2022-09-26 ENCOUNTER — Encounter: Payer: Self-pay | Admitting: *Deleted

## 2022-09-29 ENCOUNTER — Ambulatory Visit: Payer: BC Managed Care – PPO | Admitting: Certified Registered Nurse Anesthetist

## 2022-09-29 ENCOUNTER — Ambulatory Visit
Admission: RE | Admit: 2022-09-29 | Discharge: 2022-09-29 | Disposition: A | Discharge status: Home / Self Care | Payer: BC Managed Care – PPO | Attending: Gastroenterology | Admitting: Gastroenterology

## 2022-09-29 ENCOUNTER — Encounter
Admission: RE | Disposition: A | Discharge status: Home / Self Care | Payer: Self-pay | Source: Home / Self Care | Attending: Gastroenterology

## 2022-09-29 DIAGNOSIS — Z86010 Personal history of colonic polyps: Secondary | ICD-10-CM | POA: Insufficient documentation

## 2022-09-29 DIAGNOSIS — Z539 Procedure and treatment not carried out, unspecified reason: Secondary | ICD-10-CM | POA: Insufficient documentation

## 2022-09-29 HISTORY — PX: COLONOSCOPY WITH PROPOFOL: SHX5780

## 2022-09-29 SURGERY — COLONOSCOPY WITH PROPOFOL
Anesthesia: General

## 2022-09-29 MED ORDER — SODIUM CHLORIDE 0.9 % IV SOLN: INTRAVENOUS | Status: DC

## 2022-09-30 ENCOUNTER — Encounter: Payer: Self-pay | Admitting: Gastroenterology

## 2023-01-05 ENCOUNTER — Ambulatory Visit: Admission: RE | Admit: 2023-01-05 | Payer: BC Managed Care – PPO | Source: Home / Self Care

## 2023-01-05 ENCOUNTER — Encounter: Admission: RE | Payer: Self-pay | Source: Home / Self Care

## 2023-01-05 SURGERY — COLONOSCOPY
Anesthesia: General

## 2023-03-23 ENCOUNTER — Encounter: Payer: Self-pay | Admitting: *Deleted

## 2023-03-27 ENCOUNTER — Encounter: Payer: Self-pay | Admitting: *Deleted

## 2023-03-30 ENCOUNTER — Encounter: Payer: Self-pay | Admitting: *Deleted

## 2023-03-30 ENCOUNTER — Encounter
Admission: RE | Disposition: A | Discharge status: Home / Self Care | Payer: Self-pay | Source: Home / Self Care | Attending: Gastroenterology

## 2023-03-30 ENCOUNTER — Ambulatory Visit: Payer: BC Managed Care – PPO | Admitting: Anesthesiology

## 2023-03-30 ENCOUNTER — Ambulatory Visit
Admission: RE | Admit: 2023-03-30 | Discharge: 2023-03-30 | Disposition: A | Discharge status: Home / Self Care | Payer: BC Managed Care – PPO | Attending: Gastroenterology | Admitting: Gastroenterology

## 2023-03-30 ENCOUNTER — Other Ambulatory Visit: Payer: Self-pay

## 2023-03-30 DIAGNOSIS — D120 Benign neoplasm of cecum: Secondary | ICD-10-CM | POA: Diagnosis not present

## 2023-03-30 DIAGNOSIS — K5520 Angiodysplasia of colon without hemorrhage: Secondary | ICD-10-CM | POA: Insufficient documentation

## 2023-03-30 DIAGNOSIS — I480 Paroxysmal atrial fibrillation: Secondary | ICD-10-CM | POA: Insufficient documentation

## 2023-03-30 DIAGNOSIS — Z7901 Long term (current) use of anticoagulants: Secondary | ICD-10-CM | POA: Insufficient documentation

## 2023-03-30 DIAGNOSIS — J449 Chronic obstructive pulmonary disease, unspecified: Secondary | ICD-10-CM | POA: Insufficient documentation

## 2023-03-30 DIAGNOSIS — Z7984 Long term (current) use of oral hypoglycemic drugs: Secondary | ICD-10-CM | POA: Diagnosis not present

## 2023-03-30 DIAGNOSIS — I10 Essential (primary) hypertension: Secondary | ICD-10-CM | POA: Diagnosis not present

## 2023-03-30 DIAGNOSIS — D123 Benign neoplasm of transverse colon: Secondary | ICD-10-CM | POA: Insufficient documentation

## 2023-03-30 DIAGNOSIS — Z86010 Personal history of colonic polyps: Secondary | ICD-10-CM | POA: Insufficient documentation

## 2023-03-30 DIAGNOSIS — E7800 Pure hypercholesterolemia, unspecified: Secondary | ICD-10-CM | POA: Insufficient documentation

## 2023-03-30 DIAGNOSIS — K640 First degree hemorrhoids: Secondary | ICD-10-CM | POA: Diagnosis not present

## 2023-03-30 DIAGNOSIS — Z1211 Encounter for screening for malignant neoplasm of colon: Secondary | ICD-10-CM | POA: Diagnosis present

## 2023-03-30 DIAGNOSIS — Z09 Encounter for follow-up examination after completed treatment for conditions other than malignant neoplasm: Secondary | ICD-10-CM | POA: Diagnosis not present

## 2023-03-30 DIAGNOSIS — Z9049 Acquired absence of other specified parts of digestive tract: Secondary | ICD-10-CM | POA: Diagnosis not present

## 2023-03-30 DIAGNOSIS — F17210 Nicotine dependence, cigarettes, uncomplicated: Secondary | ICD-10-CM | POA: Insufficient documentation

## 2023-03-30 HISTORY — PX: COLONOSCOPY WITH PROPOFOL: SHX5780

## 2023-03-30 HISTORY — PX: POLYPECTOMY: SHX5525

## 2023-03-30 LAB — GLUCOSE, CAPILLARY: Glucose-Capillary: 126 mg/dL — ABNORMAL HIGH (ref 70–99)

## 2023-03-30 SURGERY — COLONOSCOPY WITH PROPOFOL
Anesthesia: General

## 2023-03-30 MED ORDER — PROPOFOL 10 MG/ML IV BOLUS
INTRAVENOUS | Status: DC | PRN
  Administered 2023-03-30: 80 mg via INTRAVENOUS

## 2023-03-30 MED ORDER — PROPOFOL 500 MG/50ML IV EMUL
INTRAVENOUS | Status: DC | PRN
  Administered 2023-03-30: 150 ug/kg/min via INTRAVENOUS

## 2023-03-30 MED ORDER — SODIUM CHLORIDE 0.9 % IV SOLN: INTRAVENOUS | Status: DC

## 2023-03-30 MED ORDER — PROPOFOL 1000 MG/100ML IV EMUL
INTRAVENOUS | Status: AC
  Filled 2023-03-30: qty 100

## 2023-03-30 MED ORDER — LIDOCAINE HCL (CARDIAC) PF 100 MG/5ML IV SOSY
PREFILLED_SYRINGE | INTRAVENOUS | Status: DC | PRN
  Administered 2023-03-30: 50 mg via INTRAVENOUS

## 2023-03-30 MED ORDER — LIDOCAINE HCL (PF) 2 % IJ SOLN
INTRAMUSCULAR | Status: AC
  Filled 2023-03-30: qty 5

## 2023-03-30 NOTE — Anesthesia Preprocedure Evaluation (Addendum)
Anesthesia Evaluation  Patient identified by MRN, date of birth, ID band Patient awake    Reviewed: Allergy & Precautions, NPO status , Patient's Chart, lab work & pertinent test results  History of Anesthesia Complications Negative for: history of anesthetic complications  Airway Mallampati: I   Neck ROM: Full    Dental  (+) Missing, Chipped   Pulmonary COPD, Current Smoker (1 pack per week) and Patient abstained from smoking.   Pulmonary exam normal breath sounds clear to auscultation       Cardiovascular hypertension, Normal cardiovascular exam+ dysrhythmias (a fib on Eliquis)  Rhythm:Regular Rate:Normal  ECG 06/10/22: Normal sinus rhythm Left anterior fascicular block  Echocardiogram 2D complete: (09/12/2019) IMPRESSIONS  1. Left ventricular ejection fraction, by estimation, is 55 to 60%. The left ventricle has normal function. The left ventricle has no regional wall motion abnormalities. There is moderate left ventricular hypertrophy. Left ventricular diastolic  parameters are indeterminate.  2. Right ventricular systolic function is normal. The right ventricular size is normal. Tricuspid regurgitation signal is inadequate for assessing PA pressure.  3. The mitral valve is normal in structure. Mild mitral valve regurgitation. No evidence of mitral stenosis.  4. The aortic valve is normal in structure. Aortic valve regurgitation is not visualized. No aortic stenosis is present.  5. The inferior vena cava is dilated in size with >50% respiratory variability, suggesting right atrial pressure of 8 mmHg.   NM Myocardial Perfusion SPECT multiple (stress and rest): (11/28/2019) IMPRESSION: Abnormal myocardial perfusion scan with evidence of anterior defect relatively persistent and mixed with equivocal findings for ischemia overall he ejection fraction of 54% with normal wall motion. EKG was nondiagnostic. Conclusion abnormal myocardial  perfusion scan with anterior persistent defect with equivocal redistribution which may represent ischemia versus artifact. Would recommend further cardiac evaluation based on symptoms. Recommend medical therapy for now     Neuro/Psych negative neurological ROS     GI/Hepatic negative GI ROS,,,  Endo/Other  negative endocrine ROS    Renal/GU negative Renal ROS     Musculoskeletal   Abdominal   Peds  Hematology negative hematology ROS (+)   Anesthesia Other Findings Cardiology note 05/13/22:  Assessment   72 y.o. female with   1. Left-sided weakness  2. Equivalent angina (CMS-HCC)  3. Paroxysmal atrial fibrillation (CMS-HCC)  4. Coronary artery disease involving native coronary artery of native heart without angina pectoris  5. DOE (dyspnea on exertion)  6. Bilateral lower extremity edema  7. Essential hypertension  8. Mixed hyperlipidemia  9. Class 1 obesity due to excess calories without serious comorbidity with body mass index (BMI) of 33.0 to 33.9 in adult  10. Tobacco use   Plan   Paroxysmal atrial fibrillation, reasonably stable, rate controlled, most recent holter monitor was negative for any signs of atrial fibb/atrial flutter. Continue amiodarone (Recent CXR was unremarkable.) and metoprolol therapy.  CAD, reasonably stable, most recent Myoview was abnormal, continue management with aspirin, metoprolol and atorvastatin. (Aspirin will be resumed as stated previously) Hypertension, well controlled, patient is normotensive on today. Continue management with losartan and metoprolol. Recommend following DASH diet. Hyperlipidemia, reasonable controlled, continue management with atorvastatin. COPD, management per pulmonary  Dyspnea on exertion, chronic, reasonably stable, likely due to underlying COPD, management per pulmonary with Anoro Ellipta and albuterol inhalers; recommend continuing current therapy. Recommend  BLE edema, stable, continue Lasix 20 mg once daily as  needed for edema. Recommend following a low-sodium diet, elevating the lower extremities while sitting and utilizing compression socks.tobacco cessation.  Tobacco use, chronic, recommend tobacco cessation. Obesity, recommend modest weight loss, portion control, daily aerobic exercise for least 30 minutes as tolerated Have patient follow up in 1 year  Return in about 1 year (around 05/14/2023).    Reproductive/Obstetrics                             Anesthesia Physical Anesthesia Plan  ASA: 2  Anesthesia Plan: General   Post-op Pain Management:    Induction: Intravenous  PONV Risk Score and Plan: 2 and Propofol infusion, TIVA and Treatment may vary due to age or medical condition  Airway Management Planned: Natural Airway  Additional Equipment:   Intra-op Plan:   Post-operative Plan:   Informed Consent: I have reviewed the patients History and Physical, chart, labs and discussed the procedure including the risks, benefits and alternatives for the proposed anesthesia with the patient or authorized representative who has indicated his/her understanding and acceptance.       Plan Discussed with: CRNA  Anesthesia Plan Comments: (LMA/GETA backup discussed.  Patient consented for risks of anesthesia including but not limited to:  - adverse reactions to medications - damage to eyes, teeth, lips or other oral mucosa - nerve damage due to positioning  - sore throat or hoarseness - damage to heart, brain, nerves, lungs, other parts of body or loss of life  Informed patient about role of CRNA in peri- and intra-operative care.  Patient voiced understanding.)        Anesthesia Quick Evaluation

## 2023-03-30 NOTE — H&P (Signed)
Outpatient short stay form Pre-procedure 03/30/2023  Regis Bill, MD  Primary Physician: Emogene Morgan, MD  Reason for visit:  Surveillance  History of present illness:    72 y/o lady with history of hypertension, a. Fib on DOAC (last dose 2 days ago), and COPD here for colonoscopy for history of adenomatous and sessile serrated polyps on last colonoscopy in 2019. No family history of GI malignancies. History of cholecystectomy.     Current Facility-Administered Medications:    0.9 %  sodium chloride infusion, , Intravenous, Continuous, Nieko Clarin, Rossie Muskrat, MD, Last Rate: 20 mL/hr at 03/30/23 1025, New Bag at 03/30/23 1025  Medications Prior to Admission  Medication Sig Dispense Refill Last Dose   albuterol (PROVENTIL) (2.5 MG/3ML) 0.083% nebulizer solution Take 3 mLs (2.5 mg total) by nebulization every 2 (two) hours as needed for wheezing or shortness of breath. 75 mL 0 Past Week   albuterol (VENTOLIN HFA) 108 (90 Base) MCG/ACT inhaler Inhale 1-2 puffs into the lungs every 6 (six) hours as needed for wheezing or shortness of breath. 18 g 0 Past Week   amLODipine (NORVASC) 10 MG tablet Take 1 tablet (10 mg total) by mouth daily. 90 tablet 0 03/29/2023   apixaban (ELIQUIS) 5 MG TABS tablet Take 5 mg by mouth 2 (two) times daily.   03/27/2023   atorvastatin (LIPITOR) 20 MG tablet Take 1 tablet (20 mg total) by mouth daily. 90 tablet 0 03/29/2023   metFORMIN (GLUCOPHAGE) 850 MG tablet Take 1 tablet (850 mg total) by mouth 2 (two) times daily with a meal. 180 tablet 0 03/29/2023   EPINEPHrine (PRIMATENE MIST) 0.125 MG/ACT AERO Inhale 1-2 puffs into the lungs 4 (four) times daily as needed (asthma symptoms).      predniSONE (DELTASONE) 50 MG tablet Take 1 tablet by mouth for the next 3 days beginning tomorrow 06/11/2022 (Patient not taking: Reported on 03/30/2023) 3 tablet 0 Not Taking     Allergies  Allergen Reactions   Penicillins Other (See Comments)    Patient passed out.  Has  patient had a PCN reaction causing immediate rash, facial/tongue/throat swelling, SOB or lightheadedness with hypotension: No Has patient had a PCN reaction causing severe rash involving mucus membranes or skin necrosis: No Has patient had a PCN reaction that required hospitalization No Has patient had a PCN reaction occurring within the last 10 years: No If all of the above answers are "NO", then may proceed with Cephalosporin use. Went to hospital   Cat Hair Extract     hives     Past Medical History:  Diagnosis Date   COPD (chronic obstructive pulmonary disease) (HCC)    Hypercholesteremia    Hypertension    Irregular heart beat     Review of systems:  Otherwise negative.    Physical Exam  Gen: Alert, oriented. Appears stated age.  HEENT: PERRLA. Lungs: No respiratory distress CV: RRR Abd: soft, benign, no masses Ext: No edema    Planned procedures: Proceed with colonoscopy. The patient understands the nature of the planned procedure, indications, risks, alternatives and potential complications including but not limited to bleeding, infection, perforation, damage to internal organs and possible oversedation/side effects from anesthesia. The patient agrees and gives consent to proceed.  Please refer to procedure notes for findings, recommendations and patient disposition/instructions.     Regis Bill, MD [facility] Gastroenterology

## 2023-03-30 NOTE — Transfer of Care (Signed)
Immediate Anesthesia Transfer of Care Note  Patient: Beth Odonnell  Procedure(s) Performed: COLONOSCOPY WITH PROPOFOL POLYPECTOMY  Patient Location: PACU and Endoscopy Unit  Anesthesia Type:General  Level of Consciousness: awake  Airway & Oxygen Therapy: Patient Spontanous Breathing  Post-op Assessment: Report given to RN  Post vital signs: stable  Last Vitals:  Vitals Value Taken Time  BP    Temp    Pulse    Resp    SpO2      Last Pain:  Vitals:   03/30/23 1126  TempSrc:   PainSc: Asleep     Past Medical History:  Diagnosis Date   COPD (chronic obstructive pulmonary disease) (HCC)    Hypercholesteremia    Hypertension    Irregular heart beat    Past Surgical History:  Procedure Laterality Date   CARDIAC CATHETERIZATION N/A 07/28/2016   Procedure: Left Heart Cath and Coronary Angiography and possible PCI and stent;  Surgeon: Alwyn Pea, MD;  Location: [facility] INVASIVE CV LAB;  Service: Cardiovascular;  Laterality: N/A;   COLONOSCOPY WITH PROPOFOL N/A 05/28/2018   Procedure: COLONOSCOPY WITH PROPOFOL;  Surgeon: Wyline Mood, MD;  Location: [facility] ENDOSCOPY;  Service: Gastroenterology;  Laterality: N/A;   COLONOSCOPY WITH PROPOFOL N/A 09/29/2022   Procedure: COLONOSCOPY WITH PROPOFOL;  Surgeon: Regis Bill, MD;  Location: [facility] ENDOSCOPY;  Service: Endoscopy;  Laterality: N/A;   Scheduled Meds: Continuous Infusions:  sodium chloride 20 mL/hr at 03/30/23 1059   PRN Meds:.     Complications: No notable events documented.

## 2023-03-30 NOTE — Interval H&P Note (Signed)
History and Physical Interval Note:  03/30/2023 10:50 AM  Beth Odonnell  has presented today for surgery, with the diagnosis of V12.72 (ICD-9-CM) - Z86.010 (ICD-10-CM) - History of colon polyps.  The various methods of treatment have been discussed with the patient and family. After consideration of risks, benefits and other options for treatment, the patient has consented to  Procedure(s): COLONOSCOPY WITH PROPOFOL (N/A) as a surgical intervention.  The patient's history has been reviewed, patient examined, no change in status, stable for surgery.  I have reviewed the patient's chart and labs.  Questions were answered to the patient's satisfaction.     Regis Bill  Ok to proceed with colonoscopy

## 2023-03-30 NOTE — Op Note (Signed)
[facility] Gastroenterology Patient Name: Beth Odonnell Procedure Date: 03/30/2023 10:51 AM MRN: [id number] Account #: [id number] Date of Birth: Aug 11, 1950 Admit Type: Outpatient Age: 72 Room: [facility] ENDO ROOM 3 Gender: Female Note Status: Finalized Instrument Name: Prentice Docker [id number] Procedure:             Colonoscopy Indications:           Surveillance: Personal history of adenomatous polyps                         on last colonoscopy 5 years ago Providers:             Eather Colas MD, MD Referring MD:          Sylvie Farrier. Aycock MD (Referring MD) Medicines:             Monitored Anesthesia Care Complications:         No immediate complications. Estimated blood loss:                         Minimal. Procedure:             Pre-Anesthesia Assessment:                        - Prior to the procedure, a History and Physical was                         performed, and patient medications and allergies were                         reviewed. The patient is competent. The risks and                         benefits of the procedure and the sedation options and                         risks were discussed with the patient. All questions                         were answered and informed consent was obtained.                         Patient identification and proposed procedure were                         verified by the physician, the nurse, the                         anesthesiologist, the anesthetist and the technician                         in the endoscopy suite. Mental Status Examination:                         alert and oriented. Airway Examination: normal                         oropharyngeal airway and neck mobility. Respiratory  Examination: clear to auscultation. CV Examination:                         normal. Prophylactic Antibiotics: The patient does not                         require prophylactic antibiotics. Prior                          Anticoagulants: The patient has taken no anticoagulant                         or antiplatelet agents. ASA Grade Assessment: II - A                         patient with mild systemic disease. After reviewing                         the risks and benefits, the patient was deemed in                         satisfactory condition to undergo the procedure. The                         anesthesia plan was to use monitored anesthesia care                         (MAC). Immediately prior to administration of                         medications, the patient was re-assessed for adequacy                         to receive sedatives. The heart rate, respiratory                         rate, oxygen saturations, blood pressure, adequacy of                         pulmonary ventilation, and response to care were                         monitored throughout the procedure. The physical                         status of the patient was re-assessed after the                         procedure.                        After obtaining informed consent, the colonoscope was                         passed under direct vision. Throughout the procedure,                         the patient's blood pressure, pulse, and oxygen  saturations were monitored continuously. The                         Colonoscope was introduced through the anus and                         advanced to the the cecum, identified by appendiceal                         orifice and ileocecal valve. The colonoscopy was                         performed without difficulty. The patient tolerated                         the procedure well. The quality of the bowel                         preparation was adequate to identify polyps. The                         ileocecal valve, appendiceal orifice, and rectum were                         photographed. Findings:      The perianal and digital rectal examinations were normal.       A 3 mm polyp was found in the cecum. The polyp was sessile. The polyp       was removed with a cold snare. Resection and retrieval were complete.       Estimated blood loss was minimal.      A single large localized angioectasia without bleeding was found in the       ascending colon.      A 2 mm polyp was found in the proximal transverse colon. The polyp was       sessile. The polyp was removed with a cold snare. Resection and       retrieval were complete. Estimated blood loss was minimal.      Internal hemorrhoids were found during retroflexion. The hemorrhoids       were Grade I (internal hemorrhoids that do not prolapse).      The exam was otherwise without abnormality on direct and retroflexion       views. Impression:            - One 3 mm polyp in the cecum, removed with a cold                         snare. Resected and retrieved.                        - A single non-bleeding colonic angioectasia.                        - One 2 mm polyp in the proximal transverse colon,                         removed with a cold snare. Resected and retrieved.                        -  Internal hemorrhoids.                        - The examination was otherwise normal on direct and                         retroflexion views. Recommendation:        - Discharge patient to home.                        - Resume previous diet.                        - Continue present medications.                        - Await pathology results.                        - Repeat colonoscopy in 5 years for surveillance.                        - Return to referring physician as previously                         scheduled. Procedure Code(s):     --- Professional ---                        [phone], Colonoscopy, flexible; with removal of                         tumor(s), polyp(s), or other lesion(s) by snare                         technique Diagnosis Code(s):     --- Professional ---                        Z86.010,  Personal history of colonic polyps                        D12.0, Benign neoplasm of cecum                        D12.3, Benign neoplasm of transverse colon (hepatic                         flexure or splenic flexure)                        K55.20, Angiodysplasia of colon without hemorrhage                        K64.0, First degree hemorrhoids CPT copyright 2022 American [employer] Association. All rights reserved. The codes documented in this report are preliminary and upon coder review may  be revised to meet current compliance requirements. Eather Colas MD, MD 03/30/2023 11:27:41 AM Number of Addenda: 0 Note Initiated On: 03/30/2023 10:51 AM Scope Withdrawal Time: 0 hours 12 minutes 2 seconds  Total Procedure Duration: 0 hours 16 minutes 4 seconds  Estimated Blood Loss:  Estimated blood loss was minimal.      [facility]

## 2023-03-30 NOTE — Anesthesia Postprocedure Evaluation (Signed)
Anesthesia Post Note  Patient: Beth Odonnell  Procedure(s) Performed: COLONOSCOPY WITH PROPOFOL POLYPECTOMY  Patient location during evaluation: PACU Anesthesia Type: General Level of consciousness: awake and alert, oriented and patient cooperative Pain management: pain level controlled Vital Signs Assessment: post-procedure vital signs reviewed and stable Respiratory status: spontaneous breathing, nonlabored ventilation and respiratory function stable Cardiovascular status: blood pressure returned to baseline and stable Postop Assessment: adequate PO intake Anesthetic complications: no   No notable events documented.   Last Vitals:  Vitals:   03/30/23 1136 03/30/23 1147  BP: 138/89 (!) 162/98  Pulse: 70 65  Resp: 16 14  Temp: (!) 35.8 C   SpO2: 95% 97%    Last Pain:  Vitals:   03/30/23 1147  TempSrc:   PainSc: 0-No pain                 Reed Breech

## 2023-03-31 ENCOUNTER — Encounter: Payer: Self-pay | Admitting: Gastroenterology

## 2023-04-02 LAB — SURGICAL PATHOLOGY

## 2023-04-26 ENCOUNTER — Other Ambulatory Visit: Payer: Self-pay

## 2023-04-26 ENCOUNTER — Encounter: Payer: Self-pay | Admitting: Emergency Medicine

## 2023-04-26 ENCOUNTER — Emergency Department: Payer: BC Managed Care – PPO

## 2023-04-26 ENCOUNTER — Emergency Department
Admission: EM | Admit: 2023-04-26 | Discharge: 2023-04-26 | Disposition: A | Discharge status: Home / Self Care | Payer: BC Managed Care – PPO | Attending: Emergency Medicine | Admitting: Emergency Medicine

## 2023-04-26 DIAGNOSIS — J441 Chronic obstructive pulmonary disease with (acute) exacerbation: Secondary | ICD-10-CM | POA: Diagnosis not present

## 2023-04-26 DIAGNOSIS — R0602 Shortness of breath: Secondary | ICD-10-CM | POA: Diagnosis present

## 2023-04-26 LAB — CBC WITH DIFFERENTIAL/PLATELET
Abs Immature Granulocytes: 0.02 10*3/uL (ref 0.00–0.07)
Basophils Absolute: 0.1 10*3/uL (ref 0.0–0.1)
Basophils Relative: 1 %
Eosinophils Absolute: 0.4 10*3/uL (ref 0.0–0.5)
Eosinophils Relative: 5 %
HCT: 46.5 % — ABNORMAL HIGH (ref 36.0–46.0)
Hemoglobin: 14.7 g/dL (ref 12.0–15.0)
Immature Granulocytes: 0 %
Lymphocytes Relative: 50 %
Lymphs Abs: 3.7 10*3/uL (ref 0.7–4.0)
MCH: 27.9 pg (ref 26.0–34.0)
MCHC: 31.6 g/dL (ref 30.0–36.0)
MCV: 88.2 fL (ref 80.0–100.0)
Monocytes Absolute: 0.6 10*3/uL (ref 0.1–1.0)
Monocytes Relative: 7 %
Neutro Abs: 2.8 10*3/uL (ref 1.7–7.7)
Neutrophils Relative %: 37 %
Platelets: 246 10*3/uL (ref 150–400)
RBC: 5.27 MIL/uL — ABNORMAL HIGH (ref 3.87–5.11)
RDW: 14.7 % (ref 11.5–15.5)
WBC: 7.5 10*3/uL (ref 4.0–10.5)
nRBC: 0.3 % — ABNORMAL HIGH (ref 0.0–0.2)

## 2023-04-26 LAB — BASIC METABOLIC PANEL
Anion gap: 11 (ref 5–15)
BUN: 19 mg/dL (ref 8–23)
CO2: 25 mmol/L (ref 22–32)
Calcium: 9.1 mg/dL (ref 8.9–10.3)
Chloride: 103 mmol/L (ref 98–111)
Creatinine, Ser: 0.65 mg/dL (ref 0.44–1.00)
GFR, Estimated: >60 mL/min (ref >60)
Glucose, Bld: 111 mg/dL — ABNORMAL HIGH (ref 70–99)
Potassium: 3.4 mmol/L — ABNORMAL LOW (ref 3.5–5.1)
Sodium: 139 mmol/L (ref 135–145)

## 2023-04-26 LAB — TROPONIN I (HIGH SENSITIVITY): Troponin I (High Sensitivity): 5 ng/L (ref <18)

## 2023-04-26 MED ORDER — IPRATROPIUM-ALBUTEROL 0.5-2.5 (3) MG/3ML IN SOLN
6.0000 mL | Freq: Once | RESPIRATORY_TRACT | Status: AC
  Administered 2023-04-26: 6 mL via RESPIRATORY_TRACT
  Filled 2023-04-26: qty 6

## 2023-04-26 MED ORDER — PREDNISONE 50 MG PO TABS
50.0000 mg | ORAL_TABLET | Freq: Every day | ORAL | 0 refills | Status: AC
Start: 2023-04-26 — End: 2023-04-30

## 2023-04-26 MED ORDER — IPRATROPIUM-ALBUTEROL 0.5-2.5 (3) MG/3ML IN SOLN
3.0000 mL | Freq: Once | RESPIRATORY_TRACT | Status: AC
  Administered 2023-04-26: 3 mL via RESPIRATORY_TRACT
  Filled 2023-04-26: qty 3

## 2023-04-26 MED ORDER — PREDNISONE 20 MG PO TABS
60.0000 mg | ORAL_TABLET | Freq: Once | ORAL | Status: AC
  Administered 2023-04-26: 60 mg via ORAL
  Filled 2023-04-26: qty 3

## 2023-04-26 MED ORDER — ONDANSETRON 4 MG PO TBDP
4.0000 mg | ORAL_TABLET | Freq: Once | ORAL | Status: AC
  Administered 2023-04-26: 4 mg via ORAL
  Filled 2023-04-26: qty 1

## 2023-04-26 MED ORDER — ALBUTEROL SULFATE (2.5 MG/3ML) 0.083% IN NEBU: 2.5000 mg | INHALATION_SOLUTION | RESPIRATORY_TRACT | 0 refills | Status: AC | PRN

## 2023-04-26 NOTE — ED Provider Notes (Signed)
[facility] Provider Note    Event Date/Time   First MD Initiated Contact with Patient 04/26/23 [phone]     (approximate)   History   Shortness of Breath and Wheezing   HPI  Beth Odonnell is a 72 y.o. female who presents to the ED for evaluation of Shortness of Breath and Wheezing   Patient with history of COPD presents to the ED for evaluation of shortness of breath, wheezing and a COPD exacerbation.  She was already here in our ED as her granddaughter was patient that I saw separately, but as they are waiting for granddaughter to be seen, patient reports her breathing started getting bad and she checked herself in as well.  She actually brought a portable nebulizer and used it while waiting for my evaluation and reports feeling better by the time I see here, but still some persistent wheezing.  No pain, increased sputum production.   Physical Exam   Triage Vital Signs: ED Triage Vitals  Encounter Vitals Group     BP 04/26/23 0108 (!) 164/100     Systolic BP Percentile --      Diastolic BP Percentile --      Pulse Rate 04/26/23 0108 75     Resp 04/26/23 0108 (!) 24     Temp 04/26/23 0108 98.1 F (36.7 C)     Temp Source 04/26/23 0108 Oral     SpO2 04/26/23 0108 94 %     Weight 04/26/23 0110 199 lb 15.3 oz (90.7 kg)     Height --      Head Circumference --      Peak Flow --      Pain Score --      Pain Loc --      Pain Education --      Exclude from Growth Chart --     Most recent vital signs: Vitals:   04/26/23 0108 04/26/23 0551  BP: (!) 164/100 (!) 204/101  Pulse: 75 (!) 50  Resp: (!) 24 17  Temp: 98.1 F (36.7 C) (!) 97.5 F (36.4 C)  SpO2: 94% 95%    General: Awake, no distress.  Sitting upright at edge of the bed , she looks well.  Conversational in full sentences. CV:  Good peripheral perfusion.  Resp:  Minimal tachypnea to the low 20s.  No distress.  Diffuse expiratory wheezes with slightly decreased airflow. Abd:  No  distention.  MSK:  No deformity noted.  Neuro:  No focal deficits appreciated. Other:     ED Results / Procedures / Treatments   Labs (all labs ordered are listed, but only abnormal results are displayed) Labs Reviewed  CBC WITH DIFFERENTIAL/PLATELET - Abnormal; Notable for the following components:      Result Value   RBC 5.27 (*)    HCT 46.5 (*)    nRBC 0.3 (*)    All other components within normal limits  BASIC METABOLIC PANEL  TROPONIN I (HIGH SENSITIVITY)    EKG Sinus rhythm with a rate of 56 bpm.  Normal axis and intervals.  No clear signs of acute ischemia.  RADIOLOGY CXR interpreted by me without evidence of acute cardiopulmonary pathology.  Official radiology report(s): DG Chest 2 View  Result Date: 04/26/2023 CLINICAL DATA:  Cough. EXAM: CHEST - 2 VIEW COMPARISON:  06/10/2022 FINDINGS: Hyperinflation. Generalized airway thickening is possible. There is no edema, consolidation, effusion, or pneumothorax. Normal heart size and mediastinal contours. IMPRESSION: COPD without acute superimposed finding. Electronically  Signed   By: Tiburcio Pea M.D.   On: 04/26/2023 04:02    PROCEDURES and INTERVENTIONS:  .1-3 Lead EKG Interpretation  Performed by: Delton Prairie, MD Authorized by: Delton Prairie, MD     Interpretation: normal     ECG rate:  58   ECG rate assessment: normal     Rhythm: sinus rhythm     Ectopy: none     Conduction: normal     Medications  ipratropium-albuterol (DUONEB) 0.5-2.5 (3) MG/3ML nebulizer solution 3 mL (3 mLs Nebulization Given 04/26/23 0117)  predniSONE (DELTASONE) tablet 60 mg (60 mg Oral Given 04/26/23 0607)  ipratropium-albuterol (DUONEB) 0.5-2.5 (3) MG/3ML nebulizer solution 6 mL (6 mLs Nebulization Given 04/26/23 1610)  ondansetron (ZOFRAN-ODT) disintegrating tablet 4 mg (4 mg Oral Given 04/26/23 0631)  ipratropium-albuterol (DUONEB) 0.5-2.5 (3) MG/3ML nebulizer solution 6 mL (6 mLs Nebulization Given 04/26/23 0657)      IMPRESSION / MDM / ASSESSMENT AND PLAN / ED COURSE  I reviewed the triage vital signs and the nursing notes.  Differential diagnosis includes, but is not limited to, ACS, PTX, PNA, muscle strain/spasm, PE, dissection, anxiety, pleural effusion  {Patient presents with symptoms of an acute illness or injury that is potentially life-threatening.  Patient presents to the ED with a COPD exacerbation.  Improving symptoms with DuoNebs.  Clear CXR.  No chest pain and I doubt ACS.  CBC is normal.  Waiting on remainder of blood work, metabolic panel and troponin, around the time of signout to oncoming physician.  Suspect will be suitable for outpatient management.  Clinical Course as of 04/26/23 9604  Wynelle Link Apr 26, 2023  [id number] Reassessed after waiting for blood work.  She reports having better.  She does have improved airflow, no longer tachypneic but still has some notable wheezing.  We discussed another round of breathing treatments and she is agreeable. [DS]    Clinical Course User Index [DS] Delton Prairie, MD     FINAL CLINICAL IMPRESSION(S) / ED DIAGNOSES   Final diagnoses:  COPD exacerbation (HCC)     Rx / DC Orders   ED Discharge Orders          Ordered    predniSONE (DELTASONE) 50 MG tablet  Daily        04/26/23 0645             Note:  This document was prepared using Dragon voice recognition software and may include unintentional dictation errors.   Delton Prairie, MD 04/26/23 [phone]

## 2023-04-26 NOTE — ED Triage Notes (Addendum)
Pt in with sob and wheezing, hx of COPD. Pt has been waiting in [facility] for hours with her granddaughter, states in the time, her sob has worsened. She does state she has a cold and feels like it is moving into her lungs. She has taken a portable neb trx she brought from home, last use 1hr ago, expiratory wheezes noted in triage

## 2023-04-26 NOTE — Discharge Instructions (Addendum)
4 more days of prednisone steroids, starting tomorrow (Monday) because we already gave you the dose for Sunday.   Continue your inhalers/nebulizers at home.   Return with any worsening symptoms despite this.

## 2023-07-31 ENCOUNTER — Observation Stay
Admission: EM | Admit: 2023-07-31 | Discharge: 2023-08-01 | Disposition: A | Discharge status: Home / Self Care | Payer: Medicare Other | Attending: Internal Medicine | Admitting: Internal Medicine

## 2023-07-31 ENCOUNTER — Emergency Department: Payer: Medicare Other

## 2023-07-31 ENCOUNTER — Other Ambulatory Visit: Payer: Self-pay

## 2023-07-31 DIAGNOSIS — Z79899 Other long term (current) drug therapy: Secondary | ICD-10-CM | POA: Insufficient documentation

## 2023-07-31 DIAGNOSIS — Z7984 Long term (current) use of oral hypoglycemic drugs: Secondary | ICD-10-CM | POA: Diagnosis not present

## 2023-07-31 DIAGNOSIS — F17210 Nicotine dependence, cigarettes, uncomplicated: Secondary | ICD-10-CM | POA: Diagnosis not present

## 2023-07-31 DIAGNOSIS — E785 Hyperlipidemia, unspecified: Secondary | ICD-10-CM | POA: Diagnosis not present

## 2023-07-31 DIAGNOSIS — R0602 Shortness of breath: Secondary | ICD-10-CM | POA: Diagnosis present

## 2023-07-31 DIAGNOSIS — E876 Hypokalemia: Secondary | ICD-10-CM | POA: Insufficient documentation

## 2023-07-31 DIAGNOSIS — R051 Acute cough: Secondary | ICD-10-CM

## 2023-07-31 DIAGNOSIS — I10 Essential (primary) hypertension: Secondary | ICD-10-CM | POA: Diagnosis not present

## 2023-07-31 DIAGNOSIS — F32A Depression, unspecified: Secondary | ICD-10-CM | POA: Insufficient documentation

## 2023-07-31 DIAGNOSIS — J441 Chronic obstructive pulmonary disease with (acute) exacerbation: Principal | ICD-10-CM | POA: Insufficient documentation

## 2023-07-31 DIAGNOSIS — Z7901 Long term (current) use of anticoagulants: Secondary | ICD-10-CM | POA: Diagnosis not present

## 2023-07-31 DIAGNOSIS — Z1152 Encounter for screening for COVID-19: Secondary | ICD-10-CM | POA: Diagnosis not present

## 2023-07-31 DIAGNOSIS — R0902 Hypoxemia: Secondary | ICD-10-CM

## 2023-07-31 LAB — HEPATIC FUNCTION PANEL
ALT: 26 U/L (ref 0–44)
AST: 26 U/L (ref 15–41)
Albumin: 4.6 g/dL (ref 3.5–5.0)
Alkaline Phosphatase: 66 U/L (ref 38–126)
Bilirubin, Direct: <0.1 mg/dL (ref 0.0–0.2)
Total Bilirubin: 0.6 mg/dL (ref 0.0–1.2)
Total Protein: 8.3 g/dL — ABNORMAL HIGH (ref 6.5–8.1)

## 2023-07-31 LAB — CBC WITH DIFFERENTIAL/PLATELET
Abs Immature Granulocytes: 0.01 10*3/uL (ref 0.00–0.07)
Basophils Absolute: 0.1 10*3/uL (ref 0.0–0.1)
Basophils Relative: 1 %
Eosinophils Absolute: 0.4 10*3/uL (ref 0.0–0.5)
Eosinophils Relative: 8 %
HCT: 44.7 % (ref 36.0–46.0)
Hemoglobin: 14.2 g/dL (ref 12.0–15.0)
Immature Granulocytes: 0 %
Lymphocytes Relative: 45 %
Lymphs Abs: 2.7 10*3/uL (ref 0.7–4.0)
MCH: 28 pg (ref 26.0–34.0)
MCHC: 31.8 g/dL (ref 30.0–36.0)
MCV: 88 fL (ref 80.0–100.0)
Monocytes Absolute: 0.5 10*3/uL (ref 0.1–1.0)
Monocytes Relative: 9 %
Neutro Abs: 2.1 10*3/uL (ref 1.7–7.7)
Neutrophils Relative %: 37 %
Platelets: 256 10*3/uL (ref 150–400)
RBC: 5.08 MIL/uL (ref 3.87–5.11)
RDW: 14.6 % (ref 11.5–15.5)
WBC: 5.7 10*3/uL (ref 4.0–10.5)
nRBC: 0 % (ref 0.0–0.2)

## 2023-07-31 LAB — BASIC METABOLIC PANEL
Anion gap: 11 (ref 5–15)
BUN: 14 mg/dL (ref 8–23)
CO2: 29 mmol/L (ref 22–32)
Calcium: 8.8 mg/dL — ABNORMAL LOW (ref 8.9–10.3)
Chloride: 102 mmol/L (ref 98–111)
Creatinine, Ser: 0.65 mg/dL (ref 0.44–1.00)
GFR, Estimated: >60 mL/min (ref >60)
Glucose, Bld: 140 mg/dL — ABNORMAL HIGH (ref 70–99)
Potassium: 3.2 mmol/L — ABNORMAL LOW (ref 3.5–5.1)
Sodium: 142 mmol/L (ref 135–145)

## 2023-07-31 LAB — BLOOD GAS, VENOUS
Acid-Base Excess: 7 mmol/L — ABNORMAL HIGH (ref 0.0–2.0)
Bicarbonate: 35.3 mmol/L — ABNORMAL HIGH (ref 20.0–28.0)
O2 Saturation: 66.2 %
Patient temperature: 37
pCO2, Ven: 67 mm[Hg] — ABNORMAL HIGH (ref 44–60)
pH, Ven: 7.33 (ref 7.25–7.43)
pO2, Ven: 39 mm[Hg] (ref 32–45)

## 2023-07-31 LAB — TROPONIN I (HIGH SENSITIVITY)
Troponin I (High Sensitivity): 6 ng/L (ref <18)
Troponin I (High Sensitivity): 5 ng/L (ref <18)

## 2023-07-31 LAB — RESP PANEL BY RT-PCR (RSV, FLU A&B, COVID)  RVPGX2
Influenza A by PCR: NEGATIVE
Influenza B by PCR: NEGATIVE
Resp Syncytial Virus by PCR: NEGATIVE
SARS Coronavirus 2 by RT PCR: NEGATIVE

## 2023-07-31 LAB — BRAIN NATRIURETIC PEPTIDE: B Natriuretic Peptide: 7.1 pg/mL (ref 0.0–100.0)

## 2023-07-31 MED ORDER — METHYLPREDNISOLONE SODIUM SUCC 40 MG IJ SOLR
40.0000 mg | Freq: Two times a day (BID) | INTRAMUSCULAR | Status: AC
  Administered 2023-07-31 – 2023-08-01 (×2): 40 mg via INTRAVENOUS
  Filled 2023-07-31 (×2): qty 1

## 2023-07-31 MED ORDER — APIXABAN 5 MG PO TABS
5.0000 mg | ORAL_TABLET | Freq: Two times a day (BID) | ORAL | Status: DC
  Administered 2023-07-31 – 2023-08-01 (×2): 5 mg via ORAL
  Filled 2023-07-31 (×2): qty 1

## 2023-07-31 MED ORDER — MAGNESIUM HYDROXIDE 400 MG/5ML PO SUSP: 30.0000 mL | Freq: Every day | ORAL | Status: DC | PRN

## 2023-07-31 MED ORDER — IPRATROPIUM-ALBUTEROL 0.5-2.5 (3) MG/3ML IN SOLN
3.0000 mL | Freq: Once | RESPIRATORY_TRACT | Status: AC
  Administered 2023-07-31: 3 mL via RESPIRATORY_TRACT
  Filled 2023-07-31: qty 3

## 2023-07-31 MED ORDER — GUAIFENESIN ER 600 MG PO TB12
600.0000 mg | ORAL_TABLET | Freq: Two times a day (BID) | ORAL | Status: DC
  Administered 2023-07-31 – 2023-08-01 (×2): 600 mg via ORAL
  Filled 2023-07-31 (×2): qty 1

## 2023-07-31 MED ORDER — HYDROCOD POLI-CHLORPHE POLI ER 10-8 MG/5ML PO SUER
5.0000 mL | Freq: Two times a day (BID) | ORAL | Status: DC | PRN
  Administered 2023-08-01: 5 mL via ORAL
  Filled 2023-07-31: qty 5

## 2023-07-31 MED ORDER — AMLODIPINE BESYLATE 5 MG PO TABS
10.0000 mg | ORAL_TABLET | Freq: Every day | ORAL | Status: DC
Start: 2023-08-01 — End: 2023-08-01
  Administered 2023-08-01: 10 mg via ORAL
  Filled 2023-07-31: qty 2

## 2023-07-31 MED ORDER — TRAZODONE HCL 50 MG PO TABS: 25.0000 mg | ORAL_TABLET | Freq: Every evening | ORAL | Status: DC | PRN

## 2023-07-31 MED ORDER — ACETAMINOPHEN 325 MG RE SUPP: 650.0000 mg | Freq: Four times a day (QID) | RECTAL | Status: DC | PRN

## 2023-07-31 MED ORDER — ONDANSETRON HCL 4 MG/2ML IJ SOLN: 4.0000 mg | Freq: Four times a day (QID) | INTRAMUSCULAR | Status: DC | PRN

## 2023-07-31 MED ORDER — PREDNISONE 20 MG PO TABS
60.0000 mg | ORAL_TABLET | Freq: Once | ORAL | Status: AC
  Administered 2023-07-31: 60 mg via ORAL
  Filled 2023-07-31: qty 3

## 2023-07-31 MED ORDER — ONDANSETRON HCL 4 MG PO TABS: 4.0000 mg | ORAL_TABLET | Freq: Four times a day (QID) | ORAL | Status: DC | PRN

## 2023-07-31 MED ORDER — DOXYCYCLINE HYCLATE 100 MG PO TABS
100.0000 mg | ORAL_TABLET | Freq: Once | ORAL | Status: AC
  Administered 2023-07-31: 100 mg via ORAL
  Filled 2023-07-31: qty 1

## 2023-07-31 MED ORDER — PREDNISONE 20 MG PO TABS: 40.0000 mg | ORAL_TABLET | Freq: Every day | ORAL | Status: DC

## 2023-07-31 MED ORDER — ATORVASTATIN CALCIUM 20 MG PO TABS
20.0000 mg | ORAL_TABLET | Freq: Every day | ORAL | Status: DC
Start: 2023-08-01 — End: 2023-08-01
  Administered 2023-08-01: 20 mg via ORAL
  Filled 2023-07-31: qty 1

## 2023-07-31 MED ORDER — IPRATROPIUM-ALBUTEROL 0.5-2.5 (3) MG/3ML IN SOLN
3.0000 mL | Freq: Four times a day (QID) | RESPIRATORY_TRACT | Status: DC
  Administered 2023-07-31 – 2023-08-01 (×2): 3 mL via RESPIRATORY_TRACT
  Filled 2023-07-31 (×2): qty 3

## 2023-07-31 MED ORDER — SODIUM CHLORIDE 0.9 % IV SOLN
1.0000 g | INTRAVENOUS | Status: DC
  Administered 2023-07-31: 1 g via INTRAVENOUS
  Filled 2023-07-31: qty 10

## 2023-07-31 MED ORDER — ALBUTEROL SULFATE (2.5 MG/3ML) 0.083% IN NEBU
10.0000 mg/h | INHALATION_SOLUTION | Freq: Once | RESPIRATORY_TRACT | Status: AC
  Administered 2023-07-31: 10 mg/h via RESPIRATORY_TRACT
  Filled 2023-07-31: qty 3

## 2023-07-31 MED ORDER — SODIUM CHLORIDE 0.9 % IV SOLN
INTRAVENOUS | Status: DC
Start: 2023-07-31 — End: 2023-08-01

## 2023-07-31 MED ORDER — ACETAMINOPHEN 325 MG PO TABS
650.0000 mg | ORAL_TABLET | Freq: Four times a day (QID) | ORAL | Status: DC | PRN
Start: 2023-07-31 — End: 2023-08-01

## 2023-07-31 NOTE — H&P (Incomplete)
[place]   PATIENT NAME: Beth Odonnell    MR#:  [id number]  DATE OF BIRTH:  1950/11/04  DATE OF ADMISSION:  07/31/2023  PRIMARY CARE PHYSICIAN: Emogene Morgan, MD   Patient is coming from: Home  REQUESTING/REFERRING PHYSICIAN: Dr. Jodie Echevaria CHIEF COMPLAINT:   Chief Complaint  Patient presents with   Shortness of Breath    HISTORY OF PRESENT ILLNESS:  Beth Odonnell is a 73 y.o. African-American female with medical history significant for COPD, hypertension dyslipidemia, who presented to the emergency room with acute onset of worsening cough, with associated wheezing and dyspnea over the last 4 days.  She has been expectorating yellowish sputum.  She denied any fever or chills.  No nausea or vomiting or abdominal pain.  No chest pain or palpitations.  No dysuria, oliguria or hematuria or flank pain.  ED Course: Upon presentation to the ER, BP was 176/94 with pulse symmetry of 91% on room air and otherwise normal vital signs.  VBG showed pH 7.33 with HCO3 of 35.3.  BMP revealed mild hypokalemia of 3.2 and blood glucose was 140 with calcium of 8.8.  LFTs were remarkable for total protein of 8.3.  High sensitive troponin was 6 and later 5.  CBC was within normal.  Respiratory panel came back negative. EKG as reviewed by me : EKG showed normal sinus rhythm rate is 85 with anteroseptal Q waves. Imaging: Two-view chest x-ray showed no acute cardiopulmonary disease.  The patient was given p.o. doxycycline, 3 DuoNebs followed by albuterol continuous neb and 60 mg of p.o. prednisone.  She will be admitted to a medical telemetry bed for further evaluation and management. PAST MEDICAL HISTORY:   Past Medical History:  Diagnosis Date   COPD (chronic obstructive pulmonary disease) (HCC)    Hypercholesteremia    Hypertension    Irregular heart beat     PAST SURGICAL HISTORY:   Past Surgical History:  Procedure Laterality Date   CARDIAC CATHETERIZATION N/A 07/28/2016   Procedure:  Left Heart Cath and Coronary Angiography and possible PCI and stent;  Surgeon: Alwyn Pea, MD;  Location: [facility] INVASIVE CV LAB;  Service: Cardiovascular;  Laterality: N/A;   COLONOSCOPY WITH PROPOFOL N/A 05/28/2018   Procedure: COLONOSCOPY WITH PROPOFOL;  Surgeon: Wyline Mood, MD;  Location: [facility] ENDOSCOPY;  Service: Gastroenterology;  Laterality: N/A;   COLONOSCOPY WITH PROPOFOL N/A 09/29/2022   Procedure: COLONOSCOPY WITH PROPOFOL;  Surgeon: Regis Bill, MD;  Location: [facility] ENDOSCOPY;  Service: Endoscopy;  Laterality: N/A;   COLONOSCOPY WITH PROPOFOL N/A 03/30/2023   Procedure: COLONOSCOPY WITH PROPOFOL;  Surgeon: Regis Bill, MD;  Location: [facility] ENDOSCOPY;  Service: Endoscopy;  Laterality: N/A;   POLYPECTOMY  03/30/2023   Procedure: POLYPECTOMY;  Surgeon: Regis Bill, MD;  Location: [facility] ENDOSCOPY;  Service: Endoscopy;;    SOCIAL HISTORY:   Social History   Tobacco Use   Smoking status: Every Day    Types: Cigarettes   Smokeless tobacco: Never  Substance Use Topics   Alcohol use: No    FAMILY HISTORY:  History reviewed. No pertinent family history.  DRUG ALLERGIES:   Allergies  Allergen Reactions   Penicillins Other (See Comments)    Patient passed out.  Has patient had a PCN reaction causing immediate rash, facial/tongue/throat swelling, SOB or lightheadedness with hypotension: No Has patient had a PCN reaction causing severe rash involving mucus membranes or skin necrosis: No Has patient had a PCN reaction that required hospitalization No Has  patient had a PCN reaction occurring within the last 10 years: No If all of the above answers are "NO", then may proceed with Cephalosporin use. Went to hospital   Cat Dander     hives    REVIEW OF SYSTEMS:   ROS As per history of present illness. All pertinent systems were reviewed above. Constitutional, HEENT, cardiovascular, respiratory, GI, GU, musculoskeletal, neuro, psychiatric, endocrine,  integumentary and hematologic systems were reviewed and are otherwise negative/unremarkable except for positive findings mentioned above in the HPI.  MEDICATIONS AT HOME:   Prior to Admission medications   Medication Sig Start Date End Date Taking? Authorizing Provider  albuterol (PROVENTIL) (2.5 MG/3ML) 0.083% nebulizer solution Take 3 mLs (2.5 mg total) by nebulization every 2 (two) hours as needed for wheezing or shortness of breath. 04/26/23   Phineas Semen, MD  albuterol (VENTOLIN HFA) 108 (90 Base) MCG/ACT inhaler Inhale 1-2 puffs into the lungs every 6 (six) hours as needed for wheezing or shortness of breath. 09/27/20   Darlin Drop, DO  amLODipine (NORVASC) 10 MG tablet Take 1 tablet (10 mg total) by mouth daily. 09/28/20 03/30/23  Darlin Drop, DO  apixaban (ELIQUIS) 5 MG TABS tablet Take 5 mg by mouth 2 (two) times daily.    [provider]  atorvastatin (LIPITOR) 20 MG tablet Take 1 tablet (20 mg total) by mouth daily. 09/27/20 03/30/23  Darlin Drop, DO  EPINEPHrine (PRIMATENE MIST) 0.125 MG/ACT AERO Inhale 1-2 puffs into the lungs 4 (four) times daily as needed (asthma symptoms).    [provider]  metFORMIN (GLUCOPHAGE) 850 MG tablet Take 1 tablet (850 mg total) by mouth 2 (two) times daily with a meal. 09/27/20 03/30/23  Darlin Drop, DO  predniSONE (DELTASONE) 50 MG tablet Take 1 tablet by mouth for the next 3 days beginning tomorrow 06/11/2022 Patient not taking: Reported on 03/30/2023 06/10/22   Poggi, Eileen Stanford E, PA-C      VITAL SIGNS:  Blood pressure (!) 147/100, pulse 99, temperature 97.7 F (36.5 C), resp. rate 18, SpO2 93%.  PHYSICAL EXAMINATION:  Physical Exam  GENERAL:  73 y.o.-year-old African-American female patient lying in the bed with no acute distress.  EYES: Pupils equal, round, reactive to light and accommodation. No scleral icterus. Extraocular muscles intact.  HEENT: Head atraumatic, normocephalic. Oropharynx and nasopharynx clear.   NECK:  Supple, no jugular venous distention. No thyroid enlargement, no tenderness.  LUNGS: Diffuse expiratory wheezes with tight expiratory airflow with harsh vesicular breathing.. No use of accessory muscles of respiration.  CARDIOVASCULAR: Regular rate and rhythm, S1, S2 normal. No murmurs, rubs, or gallops.  ABDOMEN: Soft, nondistended, nontender. Bowel sounds present. No organomegaly or mass.  EXTREMITIES: No pedal edema, cyanosis, or clubbing.  NEUROLOGIC: Cranial nerves II through XII are intact. Muscle strength 5/5 in all extremities. Sensation intact. Gait not checked.  PSYCHIATRIC: The patient is alert and oriented x 3.  Normal affect and good eye contact. SKIN: No obvious rash, lesion, or ulcer.   LABORATORY PANEL:   CBC Recent Labs  Lab 07/31/23 1304  WBC 5.7  HGB 14.2  HCT 44.7  PLT 256   ------------------------------------------------------------------------------------------------------------------  Chemistries  Recent Labs  Lab 07/31/23 1304 07/31/23 1709  NA 142  --   K 3.2*  --   CL 102  --   CO2 29  --   GLUCOSE 140*  --   BUN 14  --   CREATININE 0.65  --   CALCIUM 8.8*  --  AST  --  26  ALT  --  26  ALKPHOS  --  66  BILITOT  --  0.6   ------------------------------------------------------------------------------------------------------------------  Cardiac Enzymes No results for input(s): "TROPONINI" in the last 168 hours. ------------------------------------------------------------------------------------------------------------------  RADIOLOGY:  DG Chest 2 View Result Date: 07/31/2023 CLINICAL DATA:  Shortness of breath. EXAM: CHEST - 2 VIEW COMPARISON:  04/26/2023. FINDINGS: Bilateral lung fields are clear. Bilateral costophrenic angles are clear. Note is made of elevated right hemidiaphragm. Normal cardio-mediastinal silhouette. No acute osseous abnormalities. The soft tissues are within normal limits. IMPRESSION: No active cardiopulmonary  disease. Electronically Signed   By: Jules Schick M.D.   On: 07/31/2023 14:12      IMPRESSION AND PLAN:  Assessment and Plan: * COPD exacerbation (HCC) - The patient will be admitted to a medically monitored bed. - We will place the patient IV steroid therapy with IV Solu-Medrol as well as nebulized bronchodilator therapy with duonebs q.i.d. and q.4 hours p.r.n.Marland Kitchen - Mucolytic therapy will be provided with Mucinex and antibiotic therapy with IV Rocephin. - O2 protocol will be followed.   Hypokalemia - Potassium will be replaced and check magnesium level.  Dyslipidemia - We will continue statin therapy.  Depression - Continue Prozac.  Essential hypertension - We will continue antihypertensive therapy.   DVT prophylaxis: Lovenox.  Advanced Care Planning:  Code Status: full code.  Family Communication:  The plan of care was discussed in details with the patient (and family). I answered all questions. The patient agreed to proceed with the above mentioned plan. Further management will depend upon hospital course. Disposition Plan: Back to previous home environment Consults called: none.  All the records are reviewed and case discussed with ED provider.  Status is: Inpatient   At the time of the admission, it appears that the appropriate admission status for this patient is inpatient.  This is judged to be reasonable and necessary in order to provide the required intensity of service to ensure the patient's safety given the presenting symptoms, physical exam findings and initial radiographic and laboratory data in the context of comorbid conditions.  The patient requires inpatient status due to high intensity of service, high risk of further deterioration and high frequency of surveillance required.  I certify that at the time of admission, it is my clinical judgment that the patient will require inpatient hospital care extending more than 2 midnights.                             Dispo: The patient is from: Home              Anticipated d/c is to: Home              Patient currently is not medically stable to d/c.              Difficult to place patient: No  Hannah Beat M.D on 08/01/2023 at 3:14 AM  [facility]   From 7 PM-7 AM, contact night-coverage [email or web address]  CC: Primary care physician; Emogene Morgan, MD

## 2023-07-31 NOTE — ED Provider Triage Note (Signed)
Emergency Medicine Provider Triage Evaluation Note  Beth Odonnell , a 73 y.o. female  was evaluated in triage.  Pt complains of SOB, cough. Patient is Sob at baseline due to COPD but feels that it is worsening, likely due to her cough.  Review of Systems  Positive: SOB, cough Negative: fever  Physical Exam  There were no vitals taken for this visit. Gen:   Awake, no distress   Resp:  Normal effort  MSK:   Moves extremities without difficulty  Other:    Medical Decision Making  Medically screening exam initiated at 1:02 PM.  Appropriate orders placed.  Mercie Balsley was informed that the remainder of the evaluation will be completed by another provider, this initial triage assessment does not replace that evaluation, and the importance of remaining in the ED until their evaluation is complete.     Cameron Ali, PA-C 07/31/23 1303

## 2023-07-31 NOTE — Progress Notes (Signed)
   07/31/23 1845  Spiritual Encounters  Type of Visit Follow up  Care provided to: Patient;Significant other  Referral source Patient request  Reason for visit Routine spiritual support  OnCall Visit Yes  Spiritual Framework  Presenting Themes Meaning/purpose/sources of inspiration;Values and beliefs  Interventions  Spiritual Care Interventions Made Compassionate presence;Reflective listening;Explored values/beliefs/practices/strengths  Intervention Outcomes  Outcomes Connection to spiritual care;Awareness around self/spiritual resourses;Awareness of support

## 2023-07-31 NOTE — ED Triage Notes (Signed)
Pt to ED via POV from home. Pt reports SOB and cough x2-3 days. Pt with hx of COPD. Pt denies sick contacts. Pt denies CP, N/V/D.

## 2023-07-31 NOTE — Progress Notes (Signed)
   07/31/23 1815  Spiritual Encounters  Type of Visit Initial  Care provided to: Patient;Significant other  Referral source Chaplain assessment  Reason for visit Routine spiritual support  OnCall Visit Yes  Spiritual Framework  Presenting Themes Meaning/purpose/sources of inspiration;Values and beliefs;Impactful experiences and emotions;Courage hope and growth  Interventions  Spiritual Care Interventions Made Established relationship of care and support;Compassionate presence;Reflective listening;Narrative/life review;Explored values/beliefs/practices/strengths;Encouragement  Intervention Outcomes  Outcomes Connection to spiritual care;Awareness around self/spiritual resourses;Awareness of support;Reduced anxiety;Reduced isolation

## 2023-07-31 NOTE — ED Provider Notes (Signed)
[facility] Provider Note    Event Date/Time   First MD Initiated Contact with Patient 07/31/23 1627     (approximate)   History   Shortness of Breath   HPI  Beth Odonnell is a 73 y.o. female with history of COPD not on oxygen, history of hypertension, diabetes, presenting with shortness of breath.  States he is wheezing, has a new cough.  States that she has grandkids were sick.  No fevers at home.  No leg swelling or chest pain or abdominal pain or nausea, vomiting, diarrhea.  Symptoms have been ongoing for about 2 to 3 days.  On independent chart review she was admitted in 2022 for shortness of breath and COPD exacerbation as well as new hypoxia.  Dyspnea improved after treatment.     Physical Exam   Triage Vital Signs: ED Triage Vitals  Encounter Vitals Group     BP 07/31/23 1302 (!) 176/94     Systolic BP Percentile --      Diastolic BP Percentile --      Pulse Rate 07/31/23 1302 90     Resp 07/31/23 1302 20     Temp 07/31/23 1302 98.7 F (37.1 C)     Temp Source 07/31/23 1302 Oral     SpO2 07/31/23 1302 91 %     Weight --      Height --      Head Circumference --      Peak Flow --      Pain Score 07/31/23 1303 0     Pain Loc --      Pain Education --      Exclude from Growth Chart --     Most recent vital signs: Vitals:   07/31/23 1630 07/31/23 1811  BP: (!) 165/102   Pulse: 69   Resp:    Temp:  97.9 F (36.6 C)  SpO2: 91%      General: Awake, no distress.  CV:  Good peripheral perfusion.  Resp:  Normal effort.  Diffuse wheezing bilaterally Abd:  No distention.  Soft nontender Other:  No lower extremity edema, no unilateral calf swelling or tenderness   ED Results / Procedures / Treatments   Labs (all labs ordered are listed, but only abnormal results are displayed) Labs Reviewed  BASIC METABOLIC PANEL - Abnormal; Notable for the following components:      Result Value   Potassium 3.2 (*)    Glucose, Bld 140  (*)    Calcium 8.8 (*)    All other components within normal limits  HEPATIC FUNCTION PANEL - Abnormal; Notable for the following components:   Total Protein 8.3 (*)    All other components within normal limits  BLOOD GAS, VENOUS - Abnormal; Notable for the following components:   pCO2, Ven 67 (*)    Bicarbonate 35.3 (*)    Acid-Base Excess 7.0 (*)    All other components within normal limits  RESP PANEL BY RT-PCR (RSV, FLU A&B, COVID)  RVPGX2  BRAIN NATRIURETIC PEPTIDE  CBC WITH DIFFERENTIAL/PLATELET  TROPONIN I (HIGH SENSITIVITY)  TROPONIN I (HIGH SENSITIVITY)     EKG  Normal sinus rhythm, rate of 85, normal QRS, T wave flattening in 1, baseline is wandering but no ischemic ST elevation, not significant change compared to prior   RADIOLOGY Chest x-ray on my read without focal consolidation   PROCEDURES:  Critical Care performed: Yes, see critical care procedure note(s)  .Critical Care  Performed by:  Claybon Jabs, MD Authorized by: Claybon Jabs, MD   Critical care provider statement:    Critical care time (minutes):  40   Critical care was necessary to treat or prevent imminent or life-threatening deterioration of the following conditions:  Respiratory failure   Critical care was time spent personally by me on the following activities:  Development of treatment plan with patient or surrogate, discussions with consultants, evaluation of patient's response to treatment, examination of patient, ordering and review of laboratory studies, ordering and review of radiographic studies, ordering and performing treatments and interventions, pulse oximetry, re-evaluation of patient's condition and review of old charts    MEDICATIONS ORDERED IN ED: Medications  albuterol (PROVENTIL) (2.5 MG/3ML) 0.083% nebulizer solution (has no administration in time range)  ipratropium-albuterol (DUONEB) 0.5-2.5 (3) MG/3ML nebulizer solution 3 mL (3 mLs Nebulization Given 07/31/23 1741)   ipratropium-albuterol (DUONEB) 0.5-2.5 (3) MG/3ML nebulizer solution 3 mL (3 mLs Nebulization Given 07/31/23 1741)  ipratropium-albuterol (DUONEB) 0.5-2.5 (3) MG/3ML nebulizer solution 3 mL (3 mLs Nebulization Given 07/31/23 1741)  predniSONE (DELTASONE) tablet 60 mg (60 mg Oral Given 07/31/23 1739)  doxycycline (VIBRA-TABS) tablet 100 mg (100 mg Oral Given 07/31/23 1739)     IMPRESSION / MDM / ASSESSMENT AND PLAN / ED COURSE  I reviewed the triage vital signs and the nursing notes.                              Differential diagnosis includes, but is not limited to, COPD exacerbation, viral illness, pneumonia, no chest pain but did consider atypical ACS, denies history of heart failure, does not appear fluid overloaded, doubt CHF, considered PE but patient is diffusely wheezing, this is lower on the differential.  Will get labs, EKG, troponin, chest x-ray, respiratory viral swab.  Will give her 3 DuoNebs, prednisone, doxycycline for the new cough and COPD exacerbation.  Will reassess.  Patient's presentation is most consistent with acute presentation with potential threat to life or bodily function.  Independent review of labs, troponin is negative, LFTs are not elevated, VBG showed elevated pCO2 but normal pH, this might be chronic, BNP is not elevated, electrolytes not severely deranged, no leukocytosis.  Chest x-ray without focal consolidation.  On reassessment patient would dip down into the mid 80s on room air, still has some wheezing especially on the right, will start on continuous nebs, placed her on O2, plan to have her admitted.  Shared decision making done with patient and she is agreeable to plan.  Consult the hospitalist was agreeable plan for admission will evaluate the patient.  She is admitted.  Clinical Course as of 07/31/23 2011  Fri Jul 31, 2023  1724 DG Chest 2 View No focal consolidation on my interpretation. [TT]  1804 Resp panel by RT-PCR (RSV, Flu A&B, Covid) Anterior  Nasal Swab Negative [TT]  1804 CBC with Differential No leukocytosis, H&H is stable. [TT]  1804 Basic metabolic panel(!) Electrolytes not severely deranged, creatinine is normal [TT]  1804 Brain natriuretic peptide Normal [TT]  1804 Hepatic function panel(!) LFTs are not elevated [TT]  1804 Troponin I (High Sensitivity) Negative [TT]  1924 Patient was placed on O2 due to oxygen sat to be 91, took her off O2 and she satting 92 to 94%.  On reassessment she is feeling significantly better, states she would like to go home.  Will need refills of her albuterol. [TT]  1942 On reexam after being off  oxygen, patient's oxygen sats would dip down into the mid 80s.  Placed her back on 2 L.  Will plan to have her admitted for COPD exacerbation, will give her some continuous nebs at this point.  Lungs are clear after but she still has some wheezing especially on the right. [TT]    Clinical Course User Index [TT] Jodie Echevaria, Franchot Erichsen, MD     FINAL CLINICAL IMPRESSION(S) / ED DIAGNOSES   Final diagnoses:  COPD exacerbation (HCC)  Hypoxia  SOB (shortness of breath)  Acute cough     Rx / DC Orders   ED Discharge Orders     None        Note:  This document was prepared using Dragon voice recognition software and may include unintentional dictation errors.    Claybon Jabs, MD 07/31/23 2011

## 2023-08-01 DIAGNOSIS — F32A Depression, unspecified: Secondary | ICD-10-CM | POA: Diagnosis not present

## 2023-08-01 DIAGNOSIS — E785 Hyperlipidemia, unspecified: Secondary | ICD-10-CM | POA: Insufficient documentation

## 2023-08-01 DIAGNOSIS — J441 Chronic obstructive pulmonary disease with (acute) exacerbation: Principal | ICD-10-CM

## 2023-08-01 DIAGNOSIS — E876 Hypokalemia: Secondary | ICD-10-CM

## 2023-08-01 LAB — BASIC METABOLIC PANEL
Anion gap: 12 (ref 5–15)
BUN: 15 mg/dL (ref 8–23)
CO2: 24 mmol/L (ref 22–32)
Calcium: 9 mg/dL (ref 8.9–10.3)
Chloride: 104 mmol/L (ref 98–111)
Creatinine, Ser: 0.61 mg/dL (ref 0.44–1.00)
GFR, Estimated: >60 mL/min (ref >60)
Glucose, Bld: 178 mg/dL — ABNORMAL HIGH (ref 70–99)
Potassium: 3.5 mmol/L (ref 3.5–5.1)
Sodium: 140 mmol/L (ref 135–145)

## 2023-08-01 LAB — CBC
HCT: 43.9 % (ref 36.0–46.0)
Hemoglobin: 14.1 g/dL (ref 12.0–15.0)
MCH: 27.9 pg (ref 26.0–34.0)
MCHC: 32.1 g/dL (ref 30.0–36.0)
MCV: 86.8 fL (ref 80.0–100.0)
Platelets: 271 10*3/uL (ref 150–400)
RBC: 5.06 MIL/uL (ref 3.87–5.11)
RDW: 14.4 % (ref 11.5–15.5)
WBC: 5.4 10*3/uL (ref 4.0–10.5)
nRBC: 0 % (ref 0.0–0.2)

## 2023-08-01 LAB — MAGNESIUM: Magnesium: 2 mg/dL (ref 1.7–2.4)

## 2023-08-01 MED ORDER — DOXYCYCLINE HYCLATE 100 MG PO TABS
100.0000 mg | ORAL_TABLET | Freq: Two times a day (BID) | ORAL | Status: DC
  Administered 2023-08-01: 100 mg via ORAL
  Filled 2023-08-01: qty 1

## 2023-08-01 MED ORDER — HYDROCOD POLI-CHLORPHE POLI ER 10-8 MG/5ML PO SUER: 5.0000 mL | Freq: Two times a day (BID) | ORAL | 0 refills | Status: AC | PRN

## 2023-08-01 MED ORDER — PREDNISONE 20 MG PO TABS: 40.0000 mg | ORAL_TABLET | Freq: Every day | ORAL | 0 refills | Status: AC

## 2023-08-01 MED ORDER — DOXYCYCLINE HYCLATE 100 MG PO TABS: 100.0000 mg | ORAL_TABLET | Freq: Two times a day (BID) | ORAL | 0 refills | Status: AC

## 2023-08-01 MED ORDER — POTASSIUM CHLORIDE 20 MEQ PO PACK
40.0000 meq | PACK | Freq: Once | ORAL | Status: AC
  Administered 2023-08-01: 40 meq via ORAL
  Filled 2023-08-01: qty 2

## 2023-08-01 MED ORDER — GUAIFENESIN ER 600 MG PO TB12: 600.0000 mg | ORAL_TABLET | Freq: Two times a day (BID) | ORAL | 0 refills | Status: AC

## 2023-08-01 MED ORDER — ALBUTEROL SULFATE HFA 108 (90 BASE) MCG/ACT IN AERS: 1.0000 | INHALATION_SPRAY | Freq: Four times a day (QID) | RESPIRATORY_TRACT | 2 refills | Status: AC | PRN

## 2023-08-01 NOTE — Assessment & Plan Note (Addendum)
-  The patient will be admitted to a medically monitored bed. - We will place the patient IV steroid therapy with IV Solu-Medrol as well as nebulized bronchodilator therapy with duonebs q.i.d. and q.4 hours p.r.n.Marland Kitchen - Mucolytic therapy will be provided with Mucinex and antibiotic therapy with IV Rocephin. - O2 protocol will be followed.

## 2023-08-01 NOTE — Assessment & Plan Note (Signed)
Continue Prozac

## 2023-08-01 NOTE — Assessment & Plan Note (Signed)
-   We will continue antihypertensive therapy.

## 2023-08-01 NOTE — Assessment & Plan Note (Signed)
-   Potassium will be replaced and check magnesium level.

## 2023-08-01 NOTE — Assessment & Plan Note (Signed)
-  We will continue statin therapy.

## 2023-08-01 NOTE — Care Management CC44 (Signed)
Condition Code 44 Documentation Completed  Patient Details  Name: Beth Odonnell MRN: [id number] Date of Birth: [birth year]   Condition Code 44 given:  Yes Patient signature on Condition Code 44 notice:  Yes Documentation of 2 MD's agreement:  Yes Code 44 added to claim:  Yes    Susa Simmonds, LCSWA 08/01/2023, 10:32 AM

## 2023-08-01 NOTE — Discharge Instructions (Signed)
Smoking cessation advised Use your inhalers as before Return to ER if symptoms worsen

## 2023-08-01 NOTE — Discharge Summary (Signed)
Physician Discharge Summary   Patient: Beth Odonnell MRN: [id number] DOB: April 02, 1951  Admit date:     07/31/2023  Discharge date: 08/01/23  Discharge Physician: Enedina Finner   PCP: Emogene Morgan, MD   Recommendations at discharge:   follow-up PCP in 1 to 2 weeks patient advised to call Dr. Reita Cliche office and make follow-up appointment abstain from smoking  Discharge Diagnoses: Principal Problem:   COPD exacerbation [facility]) Active Problems:   Hypokalemia   Essential hypertension   Depression   Dyslipidemia   Beth Odonnell is a 73 y.o. African-American female with medical history significant for COPD, hypertension dyslipidemia, who presented to the emergency room with acute onset of worsening cough, with associated wheezing and dyspnea over the last 4 days.  She has been expectorating yellowish sputum.  She denied any fever or chills.  chest x-ray showed no acute cardiopulmonary disease. \   COPD exacerbation (HCC) - patient overall feeling better with IV Solu-Medrol. Will switch to oral steroid. -- Empiric antibiotic. -- Patient continues to smoke. Advised cessation. -- She remained most of the time sats more than 92%. Transiently drop to 87 to 89 and recovered. Discussed with patient regarding going home with oxygen. Patient is not keen on going home with oxygen. I have discussed with her to follow-up with Dr. Meredeth Ide as outpatient. -- Patient was not in respiratory distress.    Hypokalemia - received K.   Dyslipidemia -  continue statin therapy.   Depression - Continue Prozac.   Essential hypertension - continue antihypertensive therapy.   Discharge to home. Patient will follow-up with pulmonary as outpatient show make appointment on Monday.       Disposition: Home Diet recommendation:  Discharge Diet Orders (From admission, onward)     Start     Ordered   08/01/23 0000  Diet - low sodium heart healthy        08/01/23 1478           Cardiac  diet DISCHARGE MEDICATION: Allergies as of 08/01/2023       Reactions   Penicillins Other (See Comments)   Patient passed out. Has patient had a PCN reaction causing immediate rash, facial/tongue/throat swelling, SOB or lightheadedness with hypotension: No Has patient had a PCN reaction causing severe rash involving mucus membranes or skin necrosis: No Has patient had a PCN reaction that required hospitalization No Has patient had a PCN reaction occurring within the last 10 years: No If all of the above answers are "NO", then may proceed with Cephalosporin use. Went to hospital   [facility]    hives        Medication List     TAKE these medications    albuterol (2.5 MG/3ML) 0.083% nebulizer solution Commonly known as: PROVENTIL Take 3 mLs (2.5 mg total) by nebulization every 2 (two) hours as needed for wheezing or shortness of breath.   albuterol 108 (90 Base) MCG/ACT inhaler Commonly known as: VENTOLIN HFA Inhale 1-2 puffs into the lungs every 6 (six) hours as needed for wheezing or shortness of breath.   amLODipine 10 MG tablet Commonly known as: NORVASC Take 1 tablet (10 mg total) by mouth daily.   atorvastatin 20 MG tablet Commonly known as: LIPITOR Take 1 tablet (20 mg total) by mouth daily.   chlorpheniramine-HYDROcodone 10-8 MG/5ML Commonly known as: TUSSIONEX Take 5 mLs by mouth every 12 (twelve) hours as needed for cough.   doxycycline 100 MG tablet Commonly known as: VIBRA-TABS Take 1 tablet (100  mg total) by mouth every 12 (twelve) hours for 5 days.   Eliquis 5 MG Tabs tablet Generic drug: apixaban Take 5 mg by mouth 2 (two) times daily.   guaiFENesin 600 MG 12 hr tablet Commonly known as: MUCINEX Take 1 tablet (600 mg total) by mouth 2 (two) times daily for 7 days.   metFORMIN 850 MG tablet Commonly known as: GLUCOPHAGE Take 1 tablet (850 mg total) by mouth 2 (two) times daily with a meal.   predniSONE 20 MG tablet Commonly known as:  DELTASONE Take 2 tablets (40 mg total) by mouth daily with breakfast for 4 days. Start taking on: August 02, 2023 What changed:  medication strength how much to take how to take this when to take this additional instructions   Primatene Mist 0.125 MG/ACT Aero Generic drug: EPINEPHrine Inhale 1-2 puffs into the lungs 4 (four) times daily as needed (asthma symptoms).        Follow-up Information     Aycock, Ngwe A, MD. Schedule an appointment as soon as possible for a visit in 1 week(s).   Specialty: Family Medicine Contact information: [address] [place] [address] [place] [address] [phone]         Mertie Moores, MD. Schedule an appointment as soon as possible for a visit in 1 week(s).   Specialty: Specialist Why: f/u for COPD Contact information: [address] [facility] - PULMONOLOGY [place] [address] [phone]                Discharge Exam: alert and oriented times three cardiovascular both heart sounds normal rate to a distant breath sounds. No respiratory distress no use of accessory muscle.   Condition at discharge: fair  The results of significant diagnostics from this hospitalization (including imaging, microbiology, ancillary and laboratory) are listed below for reference.   Imaging Studies: DG Chest 2 View Result Date: 07/31/2023 CLINICAL DATA:  Shortness of breath. EXAM: CHEST - 2 VIEW COMPARISON:  04/26/2023. FINDINGS: Bilateral lung fields are clear. Bilateral costophrenic angles are clear. Note is made of elevated right hemidiaphragm. Normal cardio-mediastinal silhouette. No acute osseous abnormalities. The soft tissues are within normal limits. IMPRESSION: No active cardiopulmonary disease. Electronically Signed   By: Jules Schick M.D.   On: 07/31/2023 14:12    Microbiology: Results for orders placed or performed during the hospital encounter of 07/31/23  Resp panel by RT-PCR (RSV, Flu A&B, Covid) Anterior  Nasal Swab     Status: None   Collection Time: 07/31/23  1:04 PM   Specimen: Anterior Nasal Swab  Result Value Ref Range Status   SARS Coronavirus 2 by RT PCR NEGATIVE NEGATIVE Final    Comment: (NOTE) SARS-CoV-2 target nucleic acids are NOT DETECTED.  The SARS-CoV-2 RNA is generally detectable in upper respiratory specimens during the acute phase of infection. The lowest concentration of SARS-CoV-2 viral copies this assay can detect is 138 copies/mL. A negative result does not preclude SARS-Cov-2 infection and should not be used as the sole basis for treatment or other patient management decisions. A negative result may occur with  improper specimen collection/handling, submission of specimen other than nasopharyngeal swab, presence of viral mutation(s) within the areas targeted by this assay, and inadequate number of viral copies(<138 copies/mL). A negative result must be combined with clinical observations, patient history, and epidemiological information. The expected result is Negative.  Fact Sheet for Patients:  BloggerCourse.com  Fact Sheet for Healthcare Providers:  SeriousBroker.it  This test is no  t yet approved or cleared by the [place] and  has been authorized for detection and/or diagnosis of SARS-CoV-2 by FDA under an Emergency Use Authorization (EUA). This EUA will remain  in effect (meaning this test can be used) for the duration of the COVID-19 declaration under Section 564(b)(1) of the Act, 21 U.S.C.section 360bbb-3(b)(1), unless the authorization is terminated  or revoked sooner.       Influenza A by PCR NEGATIVE NEGATIVE Final   Influenza B by PCR NEGATIVE NEGATIVE Final    Comment: (NOTE) The Xpert Xpress SARS-CoV-2/FLU/RSV plus assay is intended as an aid in the diagnosis of influenza from Nasopharyngeal swab specimens and should not be used as a sole basis for treatment. Nasal washings  and aspirates are unacceptable for Xpert Xpress SARS-CoV-2/FLU/RSV testing.  Fact Sheet for Patients: BloggerCourse.com  Fact Sheet for Healthcare Providers: SeriousBroker.it  This test is not yet approved or cleared by the [place] FDA and has been authorized for detection and/or diagnosis of SARS-CoV-2 by FDA under an Emergency Use Authorization (EUA). This EUA will remain in effect (meaning this test can be used) for the duration of the COVID-19 declaration under Section 564(b)(1) of the Act, 21 U.S.C. section 360bbb-3(b)(1), unless the authorization is terminated or revoked.     Resp Syncytial Virus by PCR NEGATIVE NEGATIVE Final    Comment: (NOTE) Fact Sheet for Patients: BloggerCourse.com  Fact Sheet for Healthcare Providers: SeriousBroker.it  This test is not yet approved or cleared by the [place] FDA and has been authorized for detection and/or diagnosis of SARS-CoV-2 by FDA under an Emergency Use Authorization (EUA). This EUA will remain in effect (meaning this test can be used) for the duration of the COVID-19 declaration under Section 564(b)(1) of the Act, 21 U.S.C. section 360bbb-3(b)(1), unless the authorization is terminated or revoked.  Performed at [facility], [address] [place], [place] [address]     Labs: CBC: Recent Labs  Lab 07/31/23 1304 08/01/23 0528  WBC 5.7 5.4  NEUTROABS 2.1  --   HGB 14.2 14.1  HCT 44.7 43.9  MCV 88.0 86.8  PLT 256 271   Basic Metabolic Panel: Recent Labs  Lab 07/31/23 1304 08/01/23 0528  NA 142 140  K 3.2* 3.5  CL 102 104  CO2 29 24  GLUCOSE 140* 178*  BUN 14 15  CREATININE 0.65 0.61  CALCIUM 8.8* 9.0  MG  --  2.0   Liver Function Tests: Recent Labs  Lab 07/31/23 1709  AST 26  ALT 26  ALKPHOS 66  BILITOT 0.6  PROT 8.3*  ALBUMIN 4.6    Discharge time spent: greater than  30 minutes.  Signed: Enedina Finner, MD [facility] 08/01/2023
